# Patient Record
Sex: Male | Born: 1990 | Hispanic: No | Marital: Single | Smoking: Never smoker
Health system: Southern US, Community
[De-identification: ages and names within clinical notes are randomized; demographics above are authoritative.]

## PROBLEM LIST (undated history)

## (undated) HISTORY — PX: TONGUE SURGERY: SHX810

---
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
## 2012-12-14 ENCOUNTER — Encounter (HOSPITAL_COMMUNITY): Payer: Self-pay | Admitting: Emergency Medicine

## 2012-12-14 ENCOUNTER — Emergency Department (INDEPENDENT_AMBULATORY_CARE_PROVIDER_SITE_OTHER): Payer: Self-pay

## 2012-12-14 ENCOUNTER — Emergency Department (INDEPENDENT_AMBULATORY_CARE_PROVIDER_SITE_OTHER)
Admission: EM | Admit: 2012-12-14 | Discharge: 2012-12-14 | Disposition: A | Discharge status: Home / Self Care | Payer: Self-pay | Source: Home / Self Care

## 2012-12-14 DIAGNOSIS — S62009A Unspecified fracture of navicular [scaphoid] bone of unspecified wrist, initial encounter for closed fracture: Secondary | ICD-10-CM

## 2012-12-14 DIAGNOSIS — M6530 Trigger finger, unspecified finger: Secondary | ICD-10-CM

## 2012-12-14 DIAGNOSIS — S62002A Unspecified fracture of navicular [scaphoid] bone of left wrist, initial encounter for closed fracture: Secondary | ICD-10-CM

## 2012-12-14 DIAGNOSIS — M65352 Trigger finger, left little finger: Secondary | ICD-10-CM

## 2012-12-14 MED ORDER — TRAMADOL HCL 50 MG PO TABS: 50.0000 mg | ORAL_TABLET | Freq: Four times a day (QID) | ORAL | Status: DC | PRN

## 2012-12-14 NOTE — ED Provider Notes (Signed)
History     CSN: [id number]  Arrival date & time 12/14/12  1431   First MD Initiated Contact with Patient 12/14/12 1533      Chief Complaint  Patient presents with  . Wrist Pain    (Consider location/radiation/quality/duration/timing/severity/associated sxs/prior treatment) HPI Comments: 22 year old male was playing basketball 2 days ago and fell onto his outstretched left arm. He is complaining of pain in the wrist and hand. Is also complaining of a click in the left finger with flexion and extension. Denies injury to the other extremities. Did not strike his head or injure his neck.   History reviewed. No pertinent past medical history.  History reviewed. No pertinent past surgical history.  History reviewed. No pertinent family history.  History  Substance Use Topics  . Smoking status: Never Smoker   . Smokeless tobacco: Not on file  . Alcohol Use: No      Review of Systems  Constitutional: Negative.   Respiratory: Negative.   Gastrointestinal: Negative.   Genitourinary: Negative.   Musculoskeletal:       As per HPI  Skin: Negative.   Neurological: Negative for dizziness, weakness, numbness and headaches.    Allergies  Review of patient's allergies indicates no known allergies.  Home Medications   Current Outpatient Rx  Name  Route  Sig  Dispense  Refill  . ibuprofen (ADVIL,MOTRIN) 800 MG tablet   Oral   Take 800 mg by mouth every 8 (eight) hours as needed for pain.         . traMADol (ULTRAM) 50 MG tablet   Oral   Take 1 tablet (50 mg total) by mouth every 6 (six) hours as needed for pain.   15 tablet   0     BP 126/82  Pulse 53  Temp(Src) 97.7 F (36.5 C) (Oral)  SpO2 99%  Physical Exam  Nursing note and vitals reviewed. Constitutional: He is oriented to person, place, and time. He appears well-developed and well-nourished.  HENT:  Head: Normocephalic and atraumatic.  Eyes: EOM are normal. Left eye exhibits no discharge.  Neck: Normal  range of motion. Neck supple.  Pulmonary/Chest: Effort normal.  Musculoskeletal:  Tenderness with mild edema over the left wrist especially volar aspect. No deformity. Mild tenderness over the hand greater tenderness over the fifth metacarpal. Is also a click at the MCP of the fifth digit with flexion and extension. No deformity or swelling. Distal neurovascular motor sensory is intact. Radial pulses 2+.  Neurological: He is alert and oriented to person, place, and time. No cranial nerve deficit.  Skin: Skin is warm and dry.  Psychiatric: He has a normal mood and affect.    ED Course  Procedures (including critical care time)  Labs Reviewed - No data to display Dg Wrist Complete Left  12/14/2012   *RADIOLOGY REPORT*  Clinical Data: Wrist pain  LEFT WRIST - COMPLETE 3+ VIEW  Comparison: None.  Findings: There is an acute transverse nondisplaced fracture of the left scaphoid.  Distal radius, ulna and other carpal bones intact. Significant soft tissue swelling. Normal alignment.  IMPRESSION: Nondisplaced acute left scaphoid fracture   Original Report Authenticated By: Judie Petit. Shick, M.D.   Dg Hand Complete Left  12/14/2012   *RADIOLOGY REPORT*  Clinical Data: Fall  LEFT HAND - COMPLETE 3+ VIEW  Comparison: None.  Findings: Nondisplaced fracture through the mid navicular.  No other fracture.  No dislocation.  Negative for arthropathy.  IMPRESSION: Nondisplaced fracture of the navicular.   Original  Report Authenticated By: Janeece Riggers, M.D.     1. Fracture of navicular bone of wrist, left, closed, initial encounter   2. Trigger little finger of left hand       MDM  A radial gutter splint and splint in the little finger in position of function. Elevate Call Dr. Janee Morn Monday for an appointment. Information above May apply ice to the wrist off and on for 20-30 miinutes at a time Tramadol 50 mg every 4-6 hours when necessary pain may also continue the ibuprofen when  necessary        Hayden Rasmussen, NP 12/14/12 1717  Hayden Rasmussen, NP 12/14/12 1719

## 2012-12-14 NOTE — ED Notes (Signed)
Reports injuring left hand and wrist on Tuesday while playing basketball.  Patient reports falling backwards on wrist, hyperflexing wrist.  Reports numbness in palm, intermittent tingling sensation.  Thumb and ring finger click with attempted movement that patient reports is not typical for him.

## 2012-12-14 NOTE — ED Notes (Signed)
Ortho tech called to apply splints. 

## 2012-12-14 NOTE — Progress Notes (Signed)
Orthopedic Tech Progress Note Patient Details:  Timothy Mcguire Jan 18, 1991 [id number]  Ortho Devices Type of Ortho Device: Ace wrap;Finger splint;Rad Gutter splint Ortho Device/Splint Location: left hand Ortho Device/Splint Interventions: Application   Symia Herdt 12/14/2012, 6:13 PM

## 2012-12-14 NOTE — ED Provider Notes (Signed)
Medical screening examination/treatment/procedure(s) were performed by resident physician or non-physician practitioner and as supervising physician I was immediately available for consultation/collaboration.   Barkley Bruns MD.   Linna Hoff, MD 12/14/12 [phone]

## 2012-12-18 NOTE — ED Notes (Signed)
pT  PHONED  REQUESTING   TRAMMADOL    BE  CHANGED   AS   IT  WAS MAKING  HIM ITCH  EARLIER    -  ITCHING  HAS  STOPPED   PT STATES HE  IS IN NO  DISTRESS  -     PT  ADVISED THAT  IF  HE  NEEDED  ANYTHING  STONGER THAN  otc  MEDS  HE  WOULD  NEED  TO BE  RESEEN     HE  WAS  ALSO  ADVISED  TO  CONTINUE  TO NOT TAKE  THE  TRAMMADOL   AS  HE  IS  DOING

## 2012-12-22 ENCOUNTER — Encounter (HOSPITAL_COMMUNITY): Payer: Self-pay | Admitting: *Deleted

## 2012-12-22 ENCOUNTER — Emergency Department (INDEPENDENT_AMBULATORY_CARE_PROVIDER_SITE_OTHER): Payer: Self-pay

## 2012-12-22 ENCOUNTER — Emergency Department (INDEPENDENT_AMBULATORY_CARE_PROVIDER_SITE_OTHER)
Admission: EM | Admit: 2012-12-22 | Discharge: 2012-12-22 | Disposition: A | Discharge status: Home / Self Care | Payer: Self-pay | Source: Home / Self Care

## 2012-12-22 DIAGNOSIS — M6530 Trigger finger, unspecified finger: Secondary | ICD-10-CM

## 2012-12-22 DIAGNOSIS — S5290XD Unspecified fracture of unspecified forearm, subsequent encounter for closed fracture with routine healing: Secondary | ICD-10-CM

## 2012-12-22 DIAGNOSIS — M65352 Trigger finger, left little finger: Secondary | ICD-10-CM

## 2012-12-22 DIAGNOSIS — IMO0001 Reserved for inherently not codable concepts without codable children: Secondary | ICD-10-CM

## 2012-12-22 DIAGNOSIS — S62002G Unspecified fracture of navicular [scaphoid] bone of left wrist, subsequent encounter for fracture with delayed healing: Secondary | ICD-10-CM

## 2012-12-22 MED ORDER — ACETAMINOPHEN-CODEINE 300-30 MG PO TABS: 1.0000 | ORAL_TABLET | ORAL | Status: DC | PRN

## 2012-12-22 NOTE — ED Notes (Signed)
Left wrist splint removed.

## 2012-12-22 NOTE — ED Provider Notes (Signed)
Medical screening examination/treatment/procedure(s) were performed by non-physician practitioner and as supervising physician I was immediately available for consultation/collaboration.  Leslee Home, M.D.  Reuben Likes, MD 12/22/12 [phone]

## 2012-12-22 NOTE — ED Provider Notes (Signed)
History     CSN: [id number]  Arrival date & time 12/22/12  1239   First MD Initiated Contact with Patient 12/22/12 1411      Chief Complaint  Patient presents with  . Wrist Pain    (Consider location/radiation/quality/duration/timing/severity/associated sxs/prior treatment) HPI Comments: This 22 year old patient was seen in the urgent care 18 days ago after a wrist injury in which he was discovered to have a left navicular fracture. It did not eyes to limit the use of the arm and keep it elevated. He is felt to do either. 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 He is complaining of persistent pain in the wrist. He also failed to call for followup with the orthopedist, presumptively due to insurance problems.   History reviewed. No pertinent past medical history.  History reviewed. No pertinent past surgical history.  No family history on file.  History  Substance Use Topics  . Smoking status: Never Smoker   . Smokeless tobacco: Not on file  . Alcohol Use: No      Review of Systems  Constitutional: Negative.   Respiratory: Negative.   Gastrointestinal: Negative.   Genitourinary: Negative.   Musculoskeletal:       As per HPI  Skin: Negative.   Neurological: Negative for dizziness, weakness, numbness and headaches.    Allergies  Tramadol  Home Medications   Current Outpatient Rx  Name  Route  Sig  Dispense  Refill  . Acetaminophen-Codeine (TYLENOL/CODEINE #3) 300-30 MG per tablet   Oral   Take 1 tablet by mouth every 4 (four) hours as needed for pain.   15 tablet   0   . ibuprofen (ADVIL,MOTRIN) 800 MG tablet   Oral   Take 800 mg by mouth every 8 (eight) hours as needed for pain.         . traMADol (ULTRAM) 50 MG tablet   Oral   Take 1 tablet (50 mg total) by mouth every 6 (six) hours as needed for pain.   15 tablet   0      BP 136/89  Pulse 87  Temp(Src) 99.1 F (37.3 C) (Oral)  Resp 16  SpO2 98%  Physical Exam  Nursing note and vitals reviewed. Constitutional: He is oriented to person, place, and time. He appears well-developed and well-nourished.  HENT:  Head: Normocephalic and atraumatic.  Eyes: EOM are normal.  Neck: Normal range of motion. Neck supple.  Pulmonary/Chest: Effort normal.  Musculoskeletal: He exhibits tenderness.  Neurological: He is alert and oriented to person, place, and time. No cranial nerve deficit.  Skin: Skin is warm and dry.  Psychiatric: He has a normal mood and affect.    ED Course  Procedures (including critical care time)  Labs Reviewed - No data to display Dg Wrist Navic Only Left  12/22/2012   *RADIOLOGY REPORT*  Clinical Data: Scaphoid fracture  DG WRIST NAVIC ONLY LEFT  Comparison: None.  Findings: Nondisplaced scaphoid waist fracture.  Associated mild sclerosis, suggesting early healing.  Otherwise unchanged.  IMPRESSION: Nondisplaced scaphoid waist fracture with early sclerosis/healing.   Original Report Authenticated By: Charline Bills, M.D.     1. Fracture of navicular bone of wrist, left, closed, with delayed healing, subsequent encounter   2. Trigger little finger of left hand       MDM  A radial gutter cast be applied  to the left wrist. A finger splint will be applied in position of function to the fifth digit. We will prescribe Tylenol #3 one by mouth q. 4 hours when necessary pain #15 Must followup with the orthopedist as soon as possible, call for appointment.        Hayden Rasmussen, NP 12/22/12 1547

## 2012-12-22 NOTE — ED Notes (Signed)
Pt was seen on 5/16 for left wrist fx; has splint in place.  States the splint "got broke" without trauma - "just from squeezing my thumb and fingers together".  States has not made appt with orthopedist yet.  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  Also requesting pain meds - states the tramadol made him itch.  Has not been taking any IBU or Tyl.  LUE digits all warm, pink, with prompt cap refill.

## 2012-12-22 NOTE — Progress Notes (Signed)
Orthopedic Tech Progress Note Patient Details:  Timothy Mcguire 12/02/1990 [id number]  Casting Type of Cast: Short arm cast (radial gutter cast) Cast Location: left arm Cast Material: Fiberglass Cast Intervention: Application  Finger splint left hand   Nikki Dom 12/22/2012, 4:26 PM

## 2014-04-07 ENCOUNTER — Emergency Department (HOSPITAL_COMMUNITY): Payer: 59

## 2014-04-07 ENCOUNTER — Emergency Department (HOSPITAL_COMMUNITY)
Admission: EM | Admit: 2014-04-07 | Discharge: 2014-04-07 | Disposition: A | Discharge status: Home / Self Care | Payer: 59 | Attending: Emergency Medicine | Admitting: Emergency Medicine

## 2014-04-07 DIAGNOSIS — Y9239 Other specified sports and athletic area as the place of occurrence of the external cause: Secondary | ICD-10-CM | POA: Insufficient documentation

## 2014-04-07 DIAGNOSIS — S99919A Unspecified injury of unspecified ankle, initial encounter: Secondary | ICD-10-CM | POA: Diagnosis present

## 2014-04-07 DIAGNOSIS — W219XXA Striking against or struck by unspecified sports equipment, initial encounter: Secondary | ICD-10-CM | POA: Diagnosis not present

## 2014-04-07 DIAGNOSIS — S8990XA Unspecified injury of unspecified lower leg, initial encounter: Secondary | ICD-10-CM | POA: Insufficient documentation

## 2014-04-07 DIAGNOSIS — Y9367 Activity, basketball: Secondary | ICD-10-CM | POA: Insufficient documentation

## 2014-04-07 DIAGNOSIS — S93409A Sprain of unspecified ligament of unspecified ankle, initial encounter: Secondary | ICD-10-CM | POA: Insufficient documentation

## 2014-04-07 DIAGNOSIS — S99929A Unspecified injury of unspecified foot, initial encounter: Secondary | ICD-10-CM

## 2014-04-07 DIAGNOSIS — S93401A Sprain of unspecified ligament of right ankle, initial encounter: Secondary | ICD-10-CM

## 2014-04-07 DIAGNOSIS — Y92838 Other recreation area as the place of occurrence of the external cause: Secondary | ICD-10-CM

## 2014-04-07 MED ORDER — IBUPROFEN 800 MG PO TABS: 800.0000 mg | ORAL_TABLET | Freq: Three times a day (TID) | ORAL | Status: DC | PRN

## 2014-04-07 MED ORDER — HYDROCODONE-ACETAMINOPHEN 5-325 MG PO TABS: 1.0000 | ORAL_TABLET | Freq: Four times a day (QID) | ORAL | Status: DC | PRN

## 2014-04-07 NOTE — ED Provider Notes (Signed)
CSN: [id number]     Arrival date & time 04/07/14  1859 History   None   This chart was scribed for non-physician practitioner Charlestine Night, PA-C working with Mirian Mo, MD, by Andrew Au, ED Scribe. This patient was seen in room WTR8/WTR8 and the patient's care was started at 7:22 PM. Chief Complaint  Patient presents with  . Ankle Pain    Patient is a 23 y.o. male presenting with ankle pain. The history is provided by the patient. No language interpreter was used.  Ankle Pain  Timothy Mcguire is a 23 y.o. male who presents to the Emergency Department complaining of right ankle injury onset 1 hour ago. Pt reports he sprained his ankle while playing basketball. Pt states he hit right ankle on the edge of court after trying to dunk the ball. Pt reports h/o multiple sprains to right ankle.    No past medical history on file. No past surgical history on file. No family history on file. History  Substance Use Topics  . Smoking status: Never Smoker   . Smokeless tobacco: Not on file  . Alcohol Use: No    Review of Systems  Musculoskeletal: Positive for arthralgias and myalgias.   Allergies  Tramadol  Home Medications   Prior to Admission medications   Medication Sig Start Date End Date Taking? Authorizing Provider  HYDROcodone-acetaminophen (NORCO/VICODIN) 5-325 MG per tablet Take 1 tablet by mouth once as needed for moderate pain.   Yes Historical Provider, MD  ibuprofen (ADVIL,MOTRIN) 800 MG tablet Take 800 mg by mouth every 8 (eight) hours as needed for pain.   Yes Historical Provider, MD   BP 130/90  Pulse 84  Temp(Src) 98.4 F (36.9 C) (Oral)  Resp 16  SpO2 99% Physical Exam  Nursing note and vitals reviewed. Constitutional: He is oriented to person, place, and time. He appears well-developed and well-nourished. No distress.  HENT:  Head: Normocephalic and atraumatic.  Pulmonary/Chest: Effort normal.  Musculoskeletal:       Right foot: He exhibits decreased  range of motion, tenderness and swelling. He exhibits no bony tenderness, normal capillary refill, no crepitus, no deformity and no laceration.  Neurological: He is alert and oriented to person, place, and time.  Skin: Skin is warm and dry.  Psychiatric: He has a normal mood and affect. His behavior is normal.    ED Course  Procedures   Imaging Review Dg Ankle Complete Right  04/07/2014   CLINICAL DATA:  Pain post injury.  EXAM: RIGHT ANKLE - COMPLETE 3+ VIEW  COMPARISON:  None.  FINDINGS: There is no evidence of fracture, dislocation, or joint effusion. There is no evidence of arthropathy or other focal bone abnormality. Soft tissues are unremarkable.  IMPRESSION: Negative.   Electronically Signed   By: Elberta Fortis M.D.   On: 04/07/2014 19:31   The patient will be treated for ankle sprain, place, and an ASO with crutches   SPLINT APPLICATION Date/Time: 7:53 PM Authorized by: Carlyle Dolly Consent: Verbal consent obtained. Risks and benefits: risks, benefits and alternatives were discussed Consent given by: patient Splint applied by: orthopedic technician Location details: Right ankle  Splint type: ASO  Supplies used: Crutches and ASO  Post-procedure: The splinted body part was neurovascularly unchanged following the procedure. Patient tolerance: Patient tolerated the procedure well with no immediate complications.     I personally performed the services described in this documentation, which was scribed in my presence. The recorded information has been reviewed and is  accurate.      Carlyle Dolly, PA-C 04/07/14 1954

## 2014-04-07 NOTE — ED Notes (Signed)
Pt reports he sprained his ankle in the last hour. Pain 8/10. Reports hx of spraining this ankle. Ambulatory.

## 2014-04-07 NOTE — Discharge Instructions (Signed)
Return here as needed.  Ice and elevate your ankle, your x-rays are negative. followup with the orthopedist as needed

## 2014-04-07 NOTE — ED Provider Notes (Signed)
Medical screening examination/treatment/procedure(s) were performed by non-physician practitioner and as supervising physician I was immediately available for consultation/collaboration.   EKG Interpretation None        Janisha Bueso, MD 04/07/14 2346 

## 2014-08-28 ENCOUNTER — Ambulatory Visit (INDEPENDENT_AMBULATORY_CARE_PROVIDER_SITE_OTHER): Payer: 59 | Admitting: Family Medicine

## 2014-08-28 VITALS — BP 136/74 | HR 58 | Temp 98.6°F | Resp 16 | Ht 73.0 in | Wt 176.2 lb

## 2014-08-28 DIAGNOSIS — L42 Pityriasis rosea: Secondary | ICD-10-CM

## 2014-08-28 DIAGNOSIS — B078 Other viral warts: Secondary | ICD-10-CM

## 2014-08-28 NOTE — Progress Notes (Signed)
    MRN: [id number] DOB: [birth year]  Subjective:   Timothy Mcguire is a 24 y.o. male presenting for 3 week history of rash. Patient's rash initially started on left medial forearm, was itchy, red. The rash is has now spread to his right forearm, torso and neck. He has not tried anything for intervention. Denies fevers, pain, pus or drainage from rash, no other lesions on palms, soles of feet, oral lesions. Has not spent time doing yardwork, had exposure to poison ivy/oak. No other contacts with similar rash. Also complains of 3 lesions on his left middle finger which he thinks are warts. They bother him with itching and when he makes a fist/grasps. Denies any other aggravating or relieving factors, no other questions or concerns.  Jaquell currently has no medications in their medication list.  He is allergic to tramadol.  Kaliel  has no past medical history on file. Also  has past surgical history that includes Tongue surgery.  ROS As in subjective.  Objective:   Vitals: BP 136/74 mmHg  Pulse 58  Temp(Src) 98.6 F (37 C) (Oral)  Resp 16  Ht 6\' 1"  (1.854 m)  Wt 176 lb 3.2 oz (79.924 kg)  BMI 23.25 kg/m2  SpO2 98%  Physical Exam  Constitutional: He is oriented to person, place, and time and well-developed, well-nourished, and in no distress.  HENT:  Mouth/Throat: Oropharynx is clear and moist.  Cardiovascular: Normal rate.   Pulmonary/Chest: Effort normal.  Musculoskeletal: Normal range of motion. He exhibits no edema or tenderness.  Neurological: He is alert and oriented to person, place, and time.  Skin: Skin is warm and dry. Rash (Multiple scattered salmon colored lesions over arms, torso and neck 1-2cm in size. Also has 3 papular cauliflower lesions <0.5cm in size over dorsal proximal 3rd left finger.) noted. No erythema.  Psychiatric: Mood and affect normal.    PROCEDURE NOTE: Cryotherapy with liquid nitrogen. Verbal consent obtained. Cleansed skin with alcohol prep pad.  Cryotherapy applied (freeze-thaw-freeze). The patient tolerated the procedure well.  Assessment and Plan :   1. Pityriasis rosea - Anticipatory guidance provided. Advised benaydryl or 2nd generation oral antihistamine for itching. If itching becomes severe, rash worsens or does not resolve in 2 weeks, return to clinic for re-evaluation, consider tinea corporis.  2. Common wart - Treated with cryotherapy today, advised to return in 2 weeks for re-evaluation and/or refreezing.   Wallis BambergMario Suede Greenawalt, PA-C [facility] [phone] 08/28/2014 8:16 PM

## 2014-08-28 NOTE — Patient Instructions (Addendum)
Pityriasis Rosea Pityriasis rosea is a rash which is probably caused by a virus. It generally starts as a scaly, red patch on the trunk (the area of the body that a t-shirt would cover) but does not appear on sun exposed areas. The rash is usually preceded by an initial larger spot called the "herald patch" a week or more before the rest of the rash appears. Generally within one to two days the rash appears rapidly on the trunk, upper arms, and sometimes the upper legs. The rash usually appears as flat, oval patches of scaly pink color. The rash can also be raised and one is able to feel it with a finger. The rash can also be finely crinkled and may slough off leaving a ring of scale around the spot. Sometimes a mild sore throat is present with the rash. It usually affects children and young adults in the spring and autumn. Women are more frequently affected than men. TREATMENT  Pityriasis rosea is a self-limited condition. This means it goes away within 4 to 8 weeks without treatment. The spots may persist for several months, especially in darker-colored skin after the rash has resolved and healed. Benadryl and steroid creams may be used if itching is a problem. SEEK MEDICAL CARE IF:   Your rash does not go away or persists longer than three months.  You develop fever and joint pain.  You develop severe headache and confusion.  You develop breathing difficulty, vomiting and/or extreme weakness. Document Released: 08/24/2001 Document Revised: 10/10/2011 Document Reviewed: 09/12/2008 [facility] Patient Information 2015 [place], [place]. This information is not intended to replace advice given to you by your health care provider. Make sure you discuss any questions you have with your health care provider.    Warts Warts are a common viral infection. They are most commonly caused by the human papillomavirus (HPV). Warts can occur at all ages. However, they occur most frequently in older children and  infrequently in the elderly. Warts may be single or multiple. Location and size varies. Warts can be spread by scratching the wart and then scratching normal skin. The life cycle of warts varies. However, most will disappear over many months to a couple years. Warts commonly do not cause problems (asymptomatic) unless they are over an area of pressure, such as the bottom of the foot. If they are large enough, they may cause pain with walking. DIAGNOSIS  Warts are most commonly diagnosed by their appearance. Tissue samples (biopsies) are not required unless the wart looks abnormal. Most warts have a rough surface, are round, oval, or irregular, and are skin-colored to light yellow, brown, or gray. They are generally less than  inch (1.3 cm), but they can be any size. TREATMENT   Observation or no treatment.  Freezing with liquid nitrogen.  High heat (cautery).  Boosting the body's immunity to fight off the wart (immunotherapy using Candida antigen).  Laser surgery.  Application of various irritants and solutions. HOME CARE INSTRUCTIONS  Follow your caregiver's instructions. No special precautions are necessary. Often, treatment may be followed by a return (recurrence) of warts. Warts are generally difficult to treat and get rid of. If treatment is done in a clinic setting, usually more than 1 treatment is required. This is usually done on only a monthly basis until the wart is completely gone. SEEK IMMEDIATE MEDICAL CARE IF: The treated skin becomes red, puffy (swollen), or painful. Document Released: 04/27/2005 Document Revised: 11/12/2012 Document Reviewed: 10/23/2009 [employer] Patient Information 2015 [place]. This information is not intended to replace advice given to you by your health care provider. Make sure you discuss any questions you have with your health care provider.

## 2014-08-28 NOTE — Progress Notes (Signed)
I have examined pt and agree with plan by Gurney MaxinMike Mani, PA-C.

## 2014-10-06 ENCOUNTER — Encounter (HOSPITAL_COMMUNITY): Payer: Self-pay | Admitting: Emergency Medicine

## 2014-10-06 ENCOUNTER — Emergency Department (HOSPITAL_COMMUNITY): Payer: Self-pay

## 2014-10-06 ENCOUNTER — Emergency Department (HOSPITAL_COMMUNITY)
Admission: EM | Admit: 2014-10-06 | Discharge: 2014-10-06 | Disposition: A | Discharge status: Home / Self Care | Payer: Self-pay | Attending: Emergency Medicine | Admitting: Emergency Medicine

## 2014-10-06 DIAGNOSIS — W228XXD Striking against or struck by other objects, subsequent encounter: Secondary | ICD-10-CM | POA: Insufficient documentation

## 2014-10-06 DIAGNOSIS — S62316G Displaced fracture of base of fifth metacarpal bone, right hand, subsequent encounter for fracture with delayed healing: Secondary | ICD-10-CM | POA: Insufficient documentation

## 2014-10-06 DIAGNOSIS — T1490XA Injury, unspecified, initial encounter: Secondary | ICD-10-CM

## 2014-10-06 MED ORDER — HYDROCODONE-ACETAMINOPHEN 5-325 MG PO TABS: 1.0000 | ORAL_TABLET | Freq: Four times a day (QID) | ORAL | Status: DC | PRN

## 2014-10-06 NOTE — Discharge Instructions (Signed)
You have a mildly angulated and displaced fracture at the base of the fifth metacarpal.  Please follow up with your hand specialist for further management of your symptoms.    Metacarpal Fracture   The metacarpal bones are in the middle of the hand, connecting the fingers to the wrist. A metacarpal fracture is a break in one of these bones. It is common for an injury of the hand to break one or more of these bones. A metacarpal fracture of the fifth (little) finger, near the knuckle, is also known as a boxer's fracture. SYMPTOMS   Severe pain at the time of injury.  Pain, tenderness, swelling (especially the back of the hand).  Bruising of the hand within 48 hours.  Visible deformity, if the fracture out of alignment (displaced).  Numbness or paralysis from swelling in the hand, causing pressure on the blood vessels or nerves (uncommon). CAUSES   Direct hit (trauma) to the hand, such as a striking blow with the fist.  Indirect stress to the hand, such as twisting or violent muscle contraction (uncommon). RISK INCREASES WITH:  Contact sports (football, rugby, soccer).  Sports that require hitting (boxing, martial arts).  History of bone or joint disease, including osteoporosis.  Poor hand strength and flexibility. PREVENTION  Maintain proper conditioning:  Hand and finger strength.  Flexibility and endurance.  For contact sports, wear properly fitted and padded protective equipment for the hand.  Learn and use proper technique when hitting, punching, and landing from a fall. PROGNOSIS If treated properly, metacarpal fractures can be expected to heal within 4 to 6 weeks. For severe injuries, surgery may be needed. RELATED COMPLICATIONS   Fracture does not heal (nonunion).  Heals in a poor position, including twisted fingers (malunion).  Chronic pain, stiffness, or swelling of the hand.  Excessive bleeding in the hand, causing pressure and injury to nerves and blood  vessels (rare).  Unstable or arthritic joint, following repeated injury or delayed treatment.  Hindrance of normal hand growth in children.  Infection in open fractures (skin broken over fracture) or at the incision or pin sites, if surgery was performed.  Shortening or injured bones.  Bony bump (spur) or loss of shape of the knuckles. TREATMENT  Treatment will vary, depending on the extent of the injury. First, ice and medicine will help reduce pain and inflammation. For a single metacarpal fracture that is not displaced and does not involve the joint, restraint is usually sufficient for healing to occur. Multiple metacarpal fractures, fractures that are displaced, or fractures involving the joint may require surgery. Surgery often involves placing pins and screws in the bones, to hold them in place. Restraint of the injury follows surgery, to allow for healing. After restraint (with or without surgery), stretching and strengthening exercises may be needed to regain strength and a full range of motion. Exercises may be done at home or with a therapist. Sometimes, depending on the sport and position, a brace or splint may be needed when first returning to sports. MEDICATION   Do not take pain medicine for 7 days before surgery.  Only take over-the-counter or prescription medicines for pain, fever, or discomfort as directed by your caregiver.  Prescription pain medicines are usually prescribed only after surgery. Use only as directed and only as much as you need. COLD THERAPY  Cold treatment (icing) should be applied for 10 to 15 minutes every 2 to 3 hours for inflammation and pain, and immediately after activity that aggravates your symptoms.  Use ice packs or an ice massage. SEEK IMMEDIATE MEDICAL CARE IF:   Pain, tenderness, or swelling gets worse even with treatment.  You have pain, numbness, or coldness in the hand.  Blue, gray, or dark color appears in the fingernails.  Any of the  following occur after surgery:  You have an oral temperature above 102 F (38.9 C), not controlled by medicine.  You have increased pain, swelling, redness, drainage of fluids, or bleeding in the affected area.  New, unexplained symptoms develop. (Drugs used in treatment may produce side effects.) Document Released: 08/01/1998 Document Revised: 10/10/2011 Document Reviewed: 10/30/2008 [facility] Patient Information 2015 [place]. This information is not intended to replace advice given to you by your health care provider. Make sure you discuss any questions you have with your health care provider.

## 2014-10-06 NOTE — ED Notes (Signed)
Pt c/o pain in swelling in R hand x 3 weeks. Pt was seen and had xrays and was diagnosed with a R hand fracture. Pt followed up with orthopaedics but lost his insurance and was unable to follow through with treatment. Pt also out of pain medication. Pt A&O x4.

## 2014-10-06 NOTE — ED Notes (Signed)
Ortho called 

## 2014-10-06 NOTE — ED Provider Notes (Signed)
CSN: [id number]     Arrival date & time 10/06/14  1403 History  This chart was scribed for non-physician practitioner Fayrene HelperBowie Jalan Fariss, PA-C working with Doug SouSam Jacubowitz, MD by Littie Deedsichard Sun, ED Scribe. This patient was seen in room WTR5/WTR5 and the patient's care was started at 2:09 PM.      Chief Complaint  Patient presents with  . Hand Injury   The history is provided by the patient. No language interpreter was used.   HPI Comments: Timothy Mcguire is a 24 y.o. male who presents to the Emergency Department complaining of sudden onset, sharp right hand pain with swelling secondary to an injury that occurred 3 weeks ago. Patient states that he was hitting a punching bag for training, and that he punched the bag wrong. [facility]. He was found to have fractures in his right hand, and was placed on a splint and pain medication. Patient was recommended to have surgery, but he has not had it done yet due to insurance issues. He has run out of his pain medication since then, and has not followed up with them since.    No past medical history on file. Past Surgical History  Procedure Laterality Date  . Tongue surgery     No family history on file. History  Substance Use Topics  . Smoking status: Never Smoker   . Smokeless tobacco: Not on file  . Alcohol Use: 1.2 oz/week    2 Standard drinks or equivalent per week    Review of Systems  Musculoskeletal: Positive for arthralgias.  Psychiatric/Behavioral: Negative for confusion.      Allergies  Tramadol  Home Medications   Prior to Admission medications   Not on File   BP 168/98 mmHg  Pulse 71  Temp(Src) 98.4 F (36.9 C) (Oral)  Resp 16  SpO2 98% Physical Exam  Constitutional: He is oriented to person, place, and time. He appears well-developed and well-nourished. No distress.  HENT:  Head: Normocephalic and atraumatic.  Mouth/Throat: Oropharynx is clear and moist. No oropharyngeal  exudate.  Eyes: Pupils are equal, round, and reactive to light.  Neck: Neck supple.  Cardiovascular: Normal rate.   Pulmonary/Chest: Effort normal.  Musculoskeletal: He exhibits tenderness.  Right hand: moderate swelling noted to ulnar aspect of hand involving the 4th and 5th metacarpals with obvious deformity. Sensation intact. Wrist with normal flexion and extension, supination and pronation. Brisk capillary refill.  Neurological: He is alert and oriented to person, place, and time. No cranial nerve deficit.  Skin: Skin is warm and dry. No rash noted.  Ecchymosis noted to palm of right hand.  Psychiatric: He has a normal mood and affect. His behavior is normal.  Nursing note and vitals reviewed.   ED Course  Procedures  DIAGNOSTIC STUDIES: Oxygen Saturation is 98% on room air, normal by my interpretation.    COORDINATION OF CARE: 2:13 PM-Discussed treatment plan which includes XR imaging with pt at bedside and pt agreed to plan.    3:13 PM X-ray demonstrate a mildly angulated and displaced fracture at the base of the fifth metacarpal. This is a closed fracture. Ulnar gutter splint applied for protection and support. Pain medication prescribed. [facility], therefore I recommend close follow-up for further management. Care discussed with Dr. Ethelda ChickJacubowitz.  Labs Review Labs Reviewed - No data to display  Imaging Review Dg Hand Complete Right  10/06/2014   CLINICAL DATA:  Pain and  swelling on the ulnar side after punching a wall through weeks ago.  EXAM: RIGHT HAND - COMPLETE 3+ VIEW  COMPARISON:  None.  FINDINGS: There is a fracture the proximal metaphysis of the fifth metacarpal with slight dorsal angulation and 2-3 mm of displacement. No other regional fractures visible.  IMPRESSION: Mildly angulated and displaced fracture at the base of the fifth metacarpal.   Electronically Signed   By: Paulina Fusi M.D.   On: 10/06/2014 15:02     EKG  Interpretation None      MDM   Final diagnoses:  Injury  Closed disp fracture of base of fifth metacarpal bone of right hand, with delayed healing, subsequent encounter    BP 168/98 mmHg  Pulse 71  Temp(Src) 98.4 F (36.9 C) (Oral)  Resp 16  SpO2 98%  I have reviewed nursing notes and vital signs. I personally reviewed the imaging tests through PACS system  I reviewed available ER/hospitalization records thought the EMR   I personally performed the services described in this documentation, which was scribed in my presence. The recorded information has been reviewed and is accurate.     Fayrene Helper, PA-C 10/06/14 1514  Doug Sou, MD 10/06/14 1743

## 2014-10-06 NOTE — Progress Notes (Signed)
  CARE MANAGEMENT ED NOTE 10/06/2014  Patient:  Timothy Mcguire,Timothy Mcguire   Account Number:  [id number]  Date Initiated:  10/06/2014  Documentation initiated by:  Timothy Mcguire,Timothy Mcguire  Subjective/Objective Assessment:   24 yr old self pay [place] pt c/o pain in swelling in R hand x 3 weeks. Pt was seen and had xrays and was diagnosed with a R hand fracture. Pt followed up with orthopaedics but lost his insurance and was unable to f/u through with     Subjective/Objective Assessment Detail:   Tx  Pt cnfirms no insurance coverage and no pcp     Action/Plan:   see notes below   Action/Plan Detail:   Anticipated DC Date:  10/06/2014     Status Recommendation to Physician:   Result of Recommendation:    Other ED Services  Consult Working Plan    DC Planning Services  Other  Outpatient Services - Pt will follow up  PCP issues  [facility] / [facility] (established/new)    Choice offered to / List presented to:            Status of service:  Completed, signed off  ED Comments:   ED Comments Detail:  CM spoke with pt who [place]. CM discussed and provided written information for self pay pcps, importance of pcp for f/u care, www.needymeds.org, www.goodrx.com, discounted pharmacies and other [place] resources such as [facility], [facility], affordable care act,  financial assistance, DSS and  health department  Reviewed resources for [place] self pay pcps like [facility], family medicine at [facility], [facility], general medical clinics, [facility] plus others, medication resources, [facility] out patient pharmacies and housing Pt voiced understanding and appreciation of resources provided  Provided [facility] contact information Pt agreed to referral Cm Completed referral

## 2015-03-12 ENCOUNTER — Emergency Department (HOSPITAL_COMMUNITY)
Admission: EM | Admit: 2015-03-12 | Discharge: 2015-03-12 | Disposition: A | Discharge status: Home / Self Care | Payer: Self-pay | Attending: Emergency Medicine | Admitting: Emergency Medicine

## 2015-03-12 ENCOUNTER — Encounter (HOSPITAL_COMMUNITY): Payer: Self-pay | Admitting: Emergency Medicine

## 2015-03-12 DIAGNOSIS — M79641 Pain in right hand: Secondary | ICD-10-CM

## 2015-03-12 MED ORDER — NAPROXEN 500 MG PO TABS: 500.0000 mg | ORAL_TABLET | Freq: Two times a day (BID) | ORAL | Status: DC

## 2015-03-12 NOTE — ED Notes (Signed)
Pt broke lateral part of right hand in February. Worse cast up until approximately 4 months ago but did not have surgery d/t insurance issues and fear of losing temp job. Lateral right hand slightly deformed and swollen. Increased pain recently. No other c/c.

## 2015-03-12 NOTE — ED Provider Notes (Signed)
CSN: [id number]     Arrival date & time 03/12/15  2215 History  This chart was scribed for non-physician provider Timothy Anis, PA-C, working with Elwin Mocha, MD by Phillis Haggis, ED Scribe. This patient was seen in room WTR7/WTR7 and patient care was started at 11:10 PM.    Chief Complaint  Patient presents with  . Hand Pain  . Hand Injury   The history is provided by the patient. No language interpreter was used.  HPI Comments: Timothy Mcguire is a 24 y.o. male who presents to the Emergency Department complaining of gradually worsening right hand pain onset 2 months ago. States that he broke his hand in February and had the hand casted, but was unable to follow up and get surgery because he did not want to lose his job and did not have insurance. States that the pain has caused him to not be able to sleep and notes a bony deformity to the lateral aspect of the dorsum of the right hand. States he is able to write and grip with the hand but states that it is very painful for him to do so. Pt states that he was given Vicodin when he first broke the hand, but has not taken anything else for pain. Denies joint swelling, wound, color change, numbness or weakness to the area. Pt states that he is right hand dominant.   No past medical history on file. Past Surgical History  Procedure Laterality Date  . Tongue surgery     No family history on file. Social History  Substance Use Topics  . Smoking status: Never Smoker   . Smokeless tobacco: Not on file  . Alcohol Use: 1.2 oz/week    2 Standard drinks or equivalent per week    Review of Systems  Musculoskeletal: Positive for arthralgias. Negative for joint swelling.  Skin: Negative for color change and wound.  Neurological: Negative for weakness and numbness.   Allergies  Tramadol  Home Medications   Prior to Admission medications   Medication Sig Start Date End Date Taking? Authorizing Provider  HYDROcodone-acetaminophen  (NORCO/VICODIN) 5-325 MG per tablet Take 1 tablet by mouth every 6 (six) hours as needed for moderate pain or severe pain. 10/06/14   Fayrene Helper, PA-C   BP 137/83 mmHg  Pulse 66  Temp(Src) 98.6 F (37 C) (Oral)  Resp 16  SpO2 100%  Physical Exam  Constitutional: He is oriented to person, place, and time. He appears well-developed and well-nourished.  HENT:  Head: Normocephalic and atraumatic.  Eyes: EOM are normal.  Neck: Normal range of motion. Neck supple.  Cardiovascular: Normal rate.   Radial and ulnar pulses intact  Pulmonary/Chest: Effort normal.  Musculoskeletal: Normal range of motion.  Tenderness noted to the metacarpal bone of the right 5th finger. Bony deformity noted to the right 5th metacarpal bone.   Neurological: He is alert and oriented to person, place, and time. He has normal strength. No cranial nerve deficit or sensory deficit. He displays a negative Romberg sign.  Skin: Skin is warm and dry.  Cap refill normal  Psychiatric: He has a normal mood and affect. His behavior is normal.  Nursing note and vitals reviewed.   ED Course  Procedures (including critical care time) DIAGNOSTIC STUDIES: Oxygen Saturation is 100% on RA, normal by my interpretation.    COORDINATION OF CARE: 11:12 PM-Discussed treatment plan which includes referral to hand surgeon and Naproxen with pt at bedside and pt agreed to plan.  Labs Review Labs Reviewed - No data to display  Imaging Review No results found.   EKG Interpretation None      MDM   Final diagnoses:  None    1. Right hand pain  Old injury noted to right hand without evidence of acute condition. Discussed follow up with hand ortho. Referral provided.   I personally performed the services described in this documentation, which was scribed in my presence. The recorded information has been reviewed and is accurate.     Timothy Anis, PA-C 03/18/15 1041  Elwin Mocha, MD 03/18/15 [phone]

## 2015-03-12 NOTE — Discharge Instructions (Signed)
FOLLOW UP WITH THE HAND ORTHOPEDIC FOR DEFINITIVE TREATMENT OF OLD INJURY. Cryotherapy Cryotherapy means treatment with cold. Ice or gel packs can be used to reduce both pain and swelling. Ice is the most helpful within the first 24 to 48 hours after an injury or flare-up from overusing a muscle or joint. Sprains, strains, spasms, burning pain, shooting pain, and aches can all be eased with ice. Ice can also be used when recovering from surgery. Ice is effective, has very few side effects, and is safe for most people to use. PRECAUTIONS  Ice is not a safe treatment option for people with:  Raynaud phenomenon. This is a condition affecting small blood vessels in the extremities. Exposure to cold may cause your problems to return.  Cold hypersensitivity. There are many forms of cold hypersensitivity, including:  Cold urticaria. Red, itchy hives appear on the skin when the tissues begin to warm after being iced.  Cold erythema. This is a red, itchy rash caused by exposure to cold.  Cold hemoglobinuria. Red blood cells break down when the tissues begin to warm after being iced. The hemoglobin that carry oxygen are passed into the urine because they cannot combine with blood proteins fast enough.  Numbness or altered sensitivity in the area being iced. If you have any of the following conditions, do not use ice until you have discussed cryotherapy with your caregiver:  Heart conditions, such as arrhythmia, angina, or chronic heart disease.  High blood pressure.  Healing wounds or open skin in the area being iced.  Current infections.  Rheumatoid arthritis.  Poor circulation.  Diabetes. Ice slows the blood flow in the region it is applied. This is beneficial when trying to stop inflamed tissues from spreading irritating chemicals to surrounding tissues. However, if you expose your skin to cold temperatures for too long or without the proper protection, you can damage your skin or nerves.  Watch for signs of skin damage due to cold. HOME CARE INSTRUCTIONS Follow these tips to use ice and cold packs safely.  Place a dry or damp towel between the ice and skin. A damp towel will cool the skin more quickly, so you may need to shorten the time that the ice is used.  For a more rapid response, add gentle compression to the ice.  Ice for no more than 10 to 20 minutes at a time. The bonier the area you are icing, the less time it will take to get the benefits of ice.  Check your skin after 5 minutes to make sure there are no signs of a poor response to cold or skin damage.  Rest 20 minutes or more between uses.  Once your skin is numb, you can end your treatment. You can test numbness by very lightly touching your skin. The touch should be so light that you do not see the skin dimple from the pressure of your fingertip. When using ice, most people will feel these normal sensations in this order: cold, burning, aching, and numbness.  Do not use ice on someone who cannot communicate their responses to pain, such as small children or people with dementia. HOW TO MAKE AN ICE PACK Ice packs are the most common way to use ice therapy. Other methods include ice massage, ice baths, and cryosprays. Muscle creams that cause a cold, tingly feeling do not offer the same benefits that ice offers and should not be used as a substitute unless recommended by your caregiver. To make an ice  pack, do one of the following:  Place crushed ice or a bag of frozen vegetables in a sealable plastic bag. Squeeze out the excess air. Place this bag inside another plastic bag. Slide the bag into a pillowcase or place a damp towel between your skin and the bag.  Mix 3 parts water with 1 part rubbing alcohol. Freeze the mixture in a sealable plastic bag. When you remove the mixture from the freezer, it will be slushy. Squeeze out the excess air. Place this bag inside another plastic bag. Slide the bag into a  pillowcase or place a damp towel between your skin and the bag. SEEK MEDICAL CARE IF:  You develop white spots on your skin. This may give the skin a blotchy (mottled) appearance.  Your skin turns blue or pale.  Your skin becomes waxy or hard.  Your swelling gets worse. MAKE SURE YOU:   Understand these instructions.  Will watch your condition.  Will get help right away if you are not doing well or get worse. Document Released: 03/14/2011 Document Revised: 12/02/2013 Document Reviewed: 03/14/2011 [facility] Patient Information 2015 [place], [place]. This information is not intended to replace advice given to you by your health care provider. Make sure you discuss any questions you have with your health care provider.

## 2015-05-27 ENCOUNTER — Emergency Department (HOSPITAL_COMMUNITY)
Admission: EM | Admit: 2015-05-27 | Discharge: 2015-05-27 | Disposition: A | Discharge status: Home / Self Care | Payer: Self-pay | Attending: Emergency Medicine | Admitting: Emergency Medicine

## 2015-05-27 ENCOUNTER — Emergency Department (HOSPITAL_COMMUNITY): Payer: Self-pay

## 2015-05-27 ENCOUNTER — Encounter (HOSPITAL_COMMUNITY): Payer: Self-pay | Admitting: Emergency Medicine

## 2015-05-27 DIAGNOSIS — S93401A Sprain of unspecified ligament of right ankle, initial encounter: Secondary | ICD-10-CM | POA: Insufficient documentation

## 2015-05-27 DIAGNOSIS — S60221A Contusion of right hand, initial encounter: Secondary | ICD-10-CM | POA: Insufficient documentation

## 2015-05-27 DIAGNOSIS — Y9389 Activity, other specified: Secondary | ICD-10-CM | POA: Insufficient documentation

## 2015-05-27 DIAGNOSIS — Y9289 Other specified places as the place of occurrence of the external cause: Secondary | ICD-10-CM | POA: Insufficient documentation

## 2015-05-27 DIAGNOSIS — W1841XA Slipping, tripping and stumbling without falling due to stepping on object, initial encounter: Secondary | ICD-10-CM | POA: Insufficient documentation

## 2015-05-27 DIAGNOSIS — Y998 Other external cause status: Secondary | ICD-10-CM | POA: Insufficient documentation

## 2015-05-27 MED ORDER — KETOROLAC TROMETHAMINE 60 MG/2ML IM SOLN
60.0000 mg | Freq: Once | INTRAMUSCULAR | Status: AC
  Administered 2015-05-27: 60 mg via INTRAMUSCULAR
  Filled 2015-05-27: qty 2

## 2015-05-27 MED ORDER — NAPROXEN 500 MG PO TABS: 500.0000 mg | ORAL_TABLET | Freq: Two times a day (BID) | ORAL | Status: AC

## 2015-05-27 NOTE — ED Provider Notes (Signed)
CSN: [id number]     Arrival date & time 05/27/15  1408 History  By signing my name below, I, Gwenyth Oberatherine Macek, attest that this documentation has been prepared under the direction and in the presence of Kerrie BuffaloHope Itzae Miralles, NP.  Electronically Signed: Gwenyth Oberatherine Macek, ED Scribe. 05/27/2015. 2:52 PM.   Chief Complaint  Patient presents with  . Ankle Pain   The history is provided by the patient. No language interpreter was used.    HPI Comments: Timothy Mcguire is a 24 y.o. male who presents to the Emergency Department complaining of constant, moderate right ankle pain that radiates along the lateral aspect of his right foot and started PTA. His pain becomes worse with inversion of his right foot. Pt reports that onset of pain started after he rolled his ankle while stepping off a curb. He has a history of right ankle sprains.   Pt also complains of constant, moderate right hand pain that started PTA after he hit his hand on the pavement in the incident above. He states a history of right hand fracture that occurred 1 year ago. Pt denies numbness.   History reviewed. No pertinent past medical history. Past Surgical History  Procedure Laterality Date  . Tongue surgery     History reviewed. No pertinent family history. Social History  Substance Use Topics  . Smoking status: Never Smoker   . Smokeless tobacco: None  . Alcohol Use: 1.2 oz/week    2 Standard drinks or equivalent per week   Review of Systems  Musculoskeletal: Positive for arthralgias.       Right ankle pain and right hand pain  Neurological: Negative for numbness.  All other systems reviewed and are negative.  Allergies  Tramadol  Home Medications   Prior to Admission medications   Medication Sig Start Date End Date Taking? Authorizing Provider  HYDROcodone-acetaminophen (NORCO/VICODIN) 5-325 MG per tablet Take 1 tablet by mouth every 6 (six) hours as needed for moderate pain or severe pain. 10/06/14  Yes Fayrene HelperBowie Tran, PA-C   naproxen (NAPROSYN) 500 MG tablet Take 1 tablet (500 mg total) by mouth 2 (two) times daily. 05/27/15   Azarias Chiou Orlene OchM Johnni Wunschel, NP   BP 137/81 mmHg  Pulse 81  Temp(Src) 97.7 F (36.5 C) (Oral)  Resp 16  SpO2 100% Physical Exam  Constitutional: He is oriented to person, place, and time. He appears well-developed and well-nourished. No distress.  HENT:  Head: Normocephalic and atraumatic.  Eyes: Conjunctivae and EOM are normal.  Neck: Normal range of motion. Neck supple. No tracheal deviation present.  Cardiovascular: Normal rate.   Pulses:      Radial pulses are 2+ on the right side.       Dorsalis pedis pulses are 2+ on the right side.       Posterior tibial pulses are 2+ on the right side.  Pulmonary/Chest: Effort normal. No respiratory distress.  Musculoskeletal: Normal range of motion.       Right ankle: He exhibits ecchymosis. He exhibits normal range of motion, no deformity, no laceration and normal pulse. Swelling: minimal. Tenderness. Lateral malleolus tenderness found. Achilles tendon normal.       Right hand: He exhibits tenderness. He exhibits normal range of motion, normal capillary refill, no laceration and no swelling.       Hands: Right ankle Tenderness over the lateral aspect, pedal pulses 2+, adequate circulation Right hand: Good movement of all fingers; tender over the dorsum of the right hand at the ulnar aspect, radial  pulses 2+, adequate circulation  Neurological: He is alert and oriented to person, place, and time.  Skin: Skin is warm and dry.  Psychiatric: He has a normal mood and affect. His behavior is normal.  Nursing note and vitals reviewed.   ED Course  Procedures   DIAGNOSTIC STUDIES: Oxygen Saturation is 100% on RA, normal by my interpretation.    COORDINATION OF CARE: 2:53 PM Discussed treatment plan with pt which includes x-rays of his right hand and ankle. Pt agreed to plan.  Labs Review Labs Reviewed - No data to display  Imaging Review Dg Ankle  Complete Right  05/27/2015  CLINICAL DATA:  24 year old male with right ankle pain following fall. Initial encounter. EXAM: RIGHT ANKLE - COMPLETE 3+ VIEW COMPARISON:  04/07/2014 radiographs FINDINGS: There is no evidence of fracture, dislocation, or joint effusion. There is no evidence of arthropathy or other focal bone abnormality. Soft tissues are unremarkable. IMPRESSION: Negative. Electronically Signed   By: Harmon Pier M.D.   On: 05/27/2015 15:50   Dg Hand Complete Right  05/27/2015  CLINICAL DATA:  Pain following fall EXAM: RIGHT HAND - COMPLETE 3+ VIEW COMPARISON:  October 06, 2014 FINDINGS: Frontal, oblique, and lateral views were obtained. There is mild bony remodeling with healing response in the area of a prior fracture of the proximal fifth metacarpal noted on prior study. No acute fracture is evident currently. No dislocation. Joint spaces appear intact. No erosive change. IMPRESSION: Bony remodeling with evidence of healing at the site of a prior fracture of the proximal fifth metacarpal. Alignment in this area is near anatomic. No acute fracture. No dislocation. No appreciable arthropathic change. Electronically Signed   By: Bretta Bang III M.D.   On: 05/27/2015 15:49    MDM  24 y.o. male with right ankle and right hand pain s/p fall. Stable for d/c without focal neuro deficits. ASO applied to right ankle, crutches, NSAIDS and follow up with ortho as needed. Discussed with the patient clinical and x-ray findings and plan of care. All questioned fully answered. He will return if any problems arise.   Final diagnoses:  Ankle sprain, right, initial encounter  Contusion, hand, right, initial encounter   I personally performed the services described in this documentation, which was scribed in my presence. The recorded information has been reviewed and is accurate.   [address] [place], NP 05/27/15 2030  Bethann Berkshire, MD 05/28/15 [phone]

## 2015-05-27 NOTE — ED Notes (Signed)
Pt c/o right ankle pain after twisting; pt sts right hand injury as well

## 2015-08-26 ENCOUNTER — Emergency Department (HOSPITAL_COMMUNITY)
Admission: EM | Admit: 2015-08-26 | Discharge: 2015-08-27 | Disposition: A | Discharge status: Home / Self Care | Payer: Self-pay | Attending: Emergency Medicine | Admitting: Emergency Medicine

## 2015-08-26 ENCOUNTER — Encounter (HOSPITAL_COMMUNITY): Payer: Self-pay | Admitting: *Deleted

## 2015-08-26 DIAGNOSIS — R110 Nausea: Secondary | ICD-10-CM | POA: Insufficient documentation

## 2015-08-26 DIAGNOSIS — R319 Hematuria, unspecified: Secondary | ICD-10-CM | POA: Insufficient documentation

## 2015-08-26 DIAGNOSIS — R109 Unspecified abdominal pain: Secondary | ICD-10-CM

## 2015-08-26 DIAGNOSIS — R1013 Epigastric pain: Secondary | ICD-10-CM | POA: Insufficient documentation

## 2015-08-26 LAB — COMPREHENSIVE METABOLIC PANEL
ALT: 26 U/L (ref 17–63)
ANION GAP: 14 (ref 5–15)
AST: 43 U/L — ABNORMAL HIGH (ref 15–41)
Albumin: 4.4 g/dL (ref 3.5–5.0)
Alkaline Phosphatase: 51 U/L (ref 38–126)
BILIRUBIN TOTAL: 0.7 mg/dL (ref 0.3–1.2)
BUN: 11 mg/dL (ref 6–20)
CO2: 24 mmol/L (ref 22–32)
Calcium: 10.1 mg/dL (ref 8.9–10.3)
Chloride: 104 mmol/L (ref 101–111)
Creatinine, Ser: 1.16 mg/dL (ref 0.61–1.24)
Glucose, Bld: 86 mg/dL (ref 65–99)
POTASSIUM: 3.9 mmol/L (ref 3.5–5.1)
Sodium: 142 mmol/L (ref 135–145)
TOTAL PROTEIN: 6.9 g/dL (ref 6.5–8.1)

## 2015-08-26 LAB — CBC
HEMATOCRIT: 41.6 % (ref 39.0–52.0)
HEMOGLOBIN: 14.8 g/dL (ref 13.0–17.0)
MCH: 32.3 pg (ref 26.0–34.0)
MCHC: 35.6 g/dL (ref 30.0–36.0)
MCV: 90.8 fL (ref 78.0–100.0)
Platelets: 268 10*3/uL (ref 150–400)
RBC: 4.58 MIL/uL (ref 4.22–5.81)
RDW: 12.9 % (ref 11.5–15.5)
WBC: 8.2 10*3/uL (ref 4.0–10.5)

## 2015-08-26 LAB — LIPASE, BLOOD: LIPASE: 29 U/L (ref 11–51)

## 2015-08-26 NOTE — ED Notes (Signed)
The pt has abd pain cold sorethroat headache nausea  All for 3 weeks

## 2015-08-26 NOTE — ED Notes (Signed)
Registration at bedside.

## 2015-08-26 NOTE — ED Provider Notes (Signed)
CSN: [id number]     Arrival date & time 08/26/15  2114 History   First MD Initiated Contact with Patient 08/26/15 2151     Chief Complaint  Patient presents with  . Generalized Body Aches    Patient is a 25 y.o. male presenting with abdominal pain. The history is provided by the patient.  Abdominal Pain Pain location:  Epigastric Pain quality: sharp and stabbing   Pain radiates to:  Does not radiate Pain severity:  Moderate Duration:  3 weeks Timing:  Intermittent Progression:  Worsening Chronicity:  New Associated symptoms: nausea   Associated symptoms: no anorexia, no fever and no vomiting   No difficulty swallowing.  He has had a sore throat and headache recently.  History reviewed. No pertinent past medical history. Past Surgical History  Procedure Laterality Date  . Tongue surgery     No family history on file. Social History  Substance Use Topics  . Smoking status: Never Smoker   . Smokeless tobacco: None  . Alcohol Use: 1.2 oz/week    2 Standard drinks or equivalent per week    Review of Systems  Constitutional: Negative for fever.  Gastrointestinal: Positive for nausea and abdominal pain. Negative for vomiting and anorexia.  All other systems reviewed and are negative.     Allergies  Tramadol  Home Medications   Prior to Admission medications   Medication Sig Start Date End Date Taking? Authorizing Provider  ibuprofen (ADVIL,MOTRIN) 200 MG tablet Take 200 mg by mouth every 6 (six) hours as needed for moderate pain.   Yes Historical Provider, MD  MELATONIN PO Take 12 mg by mouth daily as needed. For sleep   Yes Historical Provider, MD  HYDROcodone-acetaminophen (NORCO/VICODIN) 5-325 MG per tablet Take 1 tablet by mouth every 6 (six) hours as needed for moderate pain or severe pain. Patient not taking: Reported on 08/26/2015 10/06/14   Fayrene Helper, PA-C  naproxen (NAPROSYN) 500 MG tablet Take 1 tablet (500 mg total) by mouth 2 (two) times daily. Patient not  taking: Reported on 08/26/2015 05/27/15   Janne Napoleon, NP   BP 137/70 mmHg  Pulse 66  Temp(Src) 98.4 F (36.9 C)  Resp 16  Ht  (1.854 m)  Wt 74.588 kg  BMI 21.70 kg/m2  SpO2 100% Physical Exam  Constitutional: He appears well-developed and well-nourished. No distress.  HENT:  Head: Normocephalic and atraumatic.  Right Ear: External ear normal.  Left Ear: External ear normal.  Mouth/Throat: No posterior oropharyngeal edema.  Eyes: Conjunctivae are normal. Right eye exhibits no discharge. Left eye exhibits no discharge. No scleral icterus.  Neck: Neck supple. No tracheal deviation present.  Cardiovascular: Normal rate, regular rhythm and intact distal pulses.   Pulmonary/Chest: Effort normal and breath sounds normal. No stridor. No respiratory distress. He has no wheezes. He has no rales.  Abdominal: Soft. Bowel sounds are normal. He exhibits no distension. There is tenderness in the epigastric area. There is no rebound and no guarding.  Musculoskeletal: He exhibits no edema or tenderness.  Neurological: He is alert. He has normal strength. No cranial nerve deficit (no facial droop, extraocular movements intact, no slurred speech) or sensory deficit. He exhibits normal muscle tone. He displays no seizure activity. Coordination normal.  Skin: Skin is warm and dry. No rash noted.  Psychiatric: He has a normal mood and affect.  Nursing note and vitals reviewed.   ED Course  Procedures (including critical care time) Labs Review Labs Reviewed  COMPREHENSIVE METABOLIC  PANEL - Abnormal; Notable for the following:    AST 43 (*)    All other components within normal limits  LIPASE, BLOOD  CBC  URINALYSIS, ROUTINE W REFLEX MICROSCOPIC (NOT AT [facility])       MDM  Doubt acute surgical process.  Doubt obstruction, cholecystitis, pancreatitis. Suspect his symptoms are related to gastritis or possibly an ulcer.   UA pending.   Will follow up on that.  If negative, dc home with  antacids.  Follow up with a PCP     Linwood Dibbles, MD 08/27/15 1740

## 2015-08-27 LAB — URINALYSIS, ROUTINE W REFLEX MICROSCOPIC
Bilirubin Urine: NEGATIVE
Glucose, UA: NEGATIVE mg/dL
KETONES UR: NEGATIVE mg/dL
LEUKOCYTES UA: NEGATIVE
NITRITE: NEGATIVE
PH: 6 (ref 5.0–8.0)
PROTEIN: NEGATIVE mg/dL
Specific Gravity, Urine: 1.024 (ref 1.005–1.030)

## 2015-08-27 LAB — URINE MICROSCOPIC-ADD ON: Bacteria, UA: NONE SEEN

## 2015-08-27 MED ORDER — FAMOTIDINE 20 MG PO TABS: 20.0000 mg | ORAL_TABLET | Freq: Two times a day (BID) | ORAL | Status: AC

## 2015-08-27 NOTE — ED Notes (Signed)
Patient able to dress and ambulate independently 

## 2015-08-27 NOTE — ED Notes (Signed)
PA at bedside updating patient.

## 2015-08-27 NOTE — ED Provider Notes (Signed)
Following up on Urinalysis results with patient that has GERD/ gastritis symptoms. UA shows no infection but he does not hematuria. Denies noticing obvious blood in urine.  Results for orders placed or performed during the hospital encounter of 08/26/15  Lipase, blood  Result Value Ref Range   Lipase 29 11 - 51 U/L  Comprehensive metabolic panel  Result Value Ref Range   Sodium 142 135 - 145 mmol/L   Potassium 3.9 3.5 - 5.1 mmol/L   Chloride 104 101 - 111 mmol/L   CO2 24 22 - 32 mmol/L   Glucose, Bld 86 65 - 99 mg/dL   BUN 11 6 - 20 mg/dL   Creatinine, Ser 1.61 0.61 - 1.24 mg/dL   Calcium 09.6 8.9 - 04.5 mg/dL   Total Protein 6.9 6.5 - 8.1 g/dL   Albumin 4.4 3.5 - 5.0 g/dL   AST 43 (H) 15 - 41 U/L   ALT 26 17 - 63 U/L   Alkaline Phosphatase 51 38 - 126 U/L   Total Bilirubin 0.7 0.3 - 1.2 mg/dL   GFR calc non Af Amer >60 >60 mL/min   GFR calc Af Amer >60 >60 mL/min   Anion gap 14 5 - 15  CBC  Result Value Ref Range   WBC 8.2 4.0 - 10.5 K/uL   RBC 4.58 4.22 - 5.81 MIL/uL   Hemoglobin 14.8 13.0 - 17.0 g/dL   HCT 40.9 81.1 - 91.4 %   MCV 90.8 78.0 - 100.0 fL   MCH 32.3 26.0 - 34.0 pg   MCHC 35.6 30.0 - 36.0 g/dL   RDW 78.2 95.6 - 21.3 %   Platelets 268 150 - 400 K/uL  Urinalysis, Routine w reflex microscopic (not at [facility])  Result Value Ref Range   Color, Urine YELLOW YELLOW   APPearance CLEAR CLEAR   Specific Gravity, Urine 1.024 1.005 - 1.030   pH 6.0 5.0 - 8.0   Glucose, UA NEGATIVE NEGATIVE mg/dL   Hgb urine dipstick LARGE (A) NEGATIVE   Bilirubin Urine NEGATIVE NEGATIVE   Ketones, ur NEGATIVE NEGATIVE mg/dL   Protein, ur NEGATIVE NEGATIVE mg/dL   Nitrite NEGATIVE NEGATIVE   Leukocytes, UA NEGATIVE NEGATIVE  Urine microscopic-add on  Result Value Ref Range   Squamous Epithelial / LPF 0-5 (A) NONE SEEN   WBC, UA 0-5 0 - 5 WBC/hpf   RBC / HPF TOO NUMEROUS TO COUNT 0 - 5 RBC/hpf   Bacteria, UA NONE SEEN NONE SEEN    His labs are encouraging without any signs of  risk of bleeding or kidney dysfunction. He has been advised to follow-up with PCP or to referred Urologist to have his urine rechecked in 1 week. Strict return precautions given. Discussed with patient the pattern of his pain. He has no CVA tenderness, dysuria, flank pain, testicular pain, rectal pain. His pain is only epigastric in nature.   Marlon Pel, PA-C 08/27/15 [phone]

## 2015-08-27 NOTE — ED Notes (Signed)
MD at bedside updating patient.

## 2015-08-27 NOTE — ED Notes (Signed)
Pt encouraged to collect urine sample.  Patient up to restroom to attempt.  Pt gait is steady and even.

## 2015-08-27 NOTE — Discharge Instructions (Signed)
Abdominal Pain, Adult °Many things can cause abdominal pain. Usually, abdominal pain is not caused by a disease and will improve without treatment. It can often be observed and treated at home. Your health care provider will do a physical exam and possibly order blood tests and X-rays to help determine the seriousness of your pain. However, in many cases, more time must pass before a clear cause of the pain can be found. Before that point, your health care provider may not know if you need more testing or further treatment. °HOME CARE INSTRUCTIONS °Monitor your abdominal pain for any changes. The following actions may help to alleviate any discomfort you are experiencing: °· Only take over-the-counter or prescription medicines as directed by your health care provider. °· Do not take laxatives unless directed to do so by your health care provider. °· Try a clear liquid diet (broth, tea, or water) as directed by your health care provider. Slowly move to a bland diet as tolerated. °SEEK MEDICAL CARE IF: °· You have unexplained abdominal pain. °· You have abdominal pain associated with nausea or diarrhea. °· You have pain when you urinate or have a bowel movement. °· You experience abdominal pain that wakes you in the night. °· You have abdominal pain that is worsened or improved by eating food. °· You have abdominal pain that is worsened with eating fatty foods. °· You have a fever. °SEEK IMMEDIATE MEDICAL CARE IF: °· Your pain does not go away within 2 hours. °· You keep throwing up (vomiting). °· Your pain is felt only in portions of the abdomen, such as the right side or the left lower portion of the abdomen. °· You pass bloody or black tarry stools. °MAKE SURE YOU: °· Understand these instructions. °· Will watch your condition. °· Will get help right away if you are not doing well or get worse. °  °This information is not intended to replace advice given to you by your health care provider. Make sure you discuss  any questions you have with your health care provider. °  °Document Released: 04/27/2005 Document Revised: 04/08/2015 Document Reviewed: 03/27/2013 °Elsevier Interactive Patient Education ©2016 [employer] ° °Hematuria, Adult °Hematuria is blood in your urine. It can be caused by a bladder infection, kidney infection, prostate infection, kidney stone, or cancer of your urinary tract. Infections can usually be treated with medicine, and a kidney stone usually will pass through your urine. If neither of these is the cause of your hematuria, further workup to find out the reason may be needed. °It is very important that you tell your health care provider about any blood you see in your urine, even if the blood stops without treatment or happens without causing pain. Blood in your urine that happens and then stops and then happens again can be a symptom of a very serious condition. Also, pain is not a symptom in the initial stages of many urinary cancers. °HOME CARE INSTRUCTIONS  °· Drink lots of fluid, 3-4 quarts a day. If you have been diagnosed with an infection, cranberry juice is especially recommended, in addition to large amounts of water. °· Avoid caffeine, tea, and carbonated beverages because they tend to irritate the bladder. °· Avoid alcohol because it may irritate the prostate. °· Take all medicines as directed by your health care provider. °· If you were prescribed an antibiotic medicine, finish it all even if you start to feel better. °· If you have been diagnosed with a kidney stone, follow   your health care provider's instructions regarding straining your urine to catch the stone. °· Empty your bladder often. Avoid holding urine for long periods of time. °· After a bowel movement, women should cleanse front to back. Use each tissue only once. °· Empty your bladder before and after sexual intercourse if you are a male. °SEEK MEDICAL CARE IF: °· You develop back pain. °· You have a fever. °· You have a  feeling of sickness in your stomach (nausea) or vomiting. °· Your symptoms are not better in 3 days. Return sooner if you are getting worse. °SEEK IMMEDIATE MEDICAL CARE IF:  °· You develop severe vomiting and are unable to keep the medicine down. °· You develop severe back or abdominal pain despite taking your medicines. °· You begin passing a large amount of blood or clots in your urine. °· You feel extremely weak or faint, or you pass out. °MAKE SURE YOU:  °· Understand these instructions. °· Will watch your condition. °· Will get help right away if you are not doing well or get worse. °  °This information is not intended to replace advice given to you by your health care provider. Make sure you discuss any questions you have with your health care provider. °  °Document Released: 07/18/2005 Document Revised: 08/08/2014 Document Reviewed: 03/18/2013 °Elsevier Interactive Patient Education ©2016 Elsevier Inc. ° °

## 2015-09-07 ENCOUNTER — Encounter (HOSPITAL_COMMUNITY): Payer: Self-pay | Admitting: Emergency Medicine

## 2015-09-07 ENCOUNTER — Emergency Department (HOSPITAL_COMMUNITY): Payer: Self-pay

## 2015-09-07 ENCOUNTER — Emergency Department (HOSPITAL_COMMUNITY)
Admission: EM | Admit: 2015-09-07 | Discharge: 2015-09-07 | Disposition: A | Discharge status: Home / Self Care | Payer: Self-pay | Attending: Emergency Medicine | Admitting: Emergency Medicine

## 2015-09-07 DIAGNOSIS — Z79899 Other long term (current) drug therapy: Secondary | ICD-10-CM | POA: Insufficient documentation

## 2015-09-07 DIAGNOSIS — J069 Acute upper respiratory infection, unspecified: Secondary | ICD-10-CM | POA: Insufficient documentation

## 2015-09-07 LAB — RAPID STREP SCREEN (MED CTR MEBANE ONLY): Streptococcus, Group A Screen (Direct): NEGATIVE

## 2015-09-07 MED ORDER — LIDOCAINE VISCOUS 2 % MT SOLN
15.0000 mL | Freq: Once | OROMUCOSAL | Status: AC
  Administered 2015-09-07: 15 mL via OROMUCOSAL
  Filled 2015-09-07: qty 15

## 2015-09-07 MED ORDER — BENZONATATE 100 MG PO CAPS: 100.0000 mg | ORAL_CAPSULE | Freq: Three times a day (TID) | ORAL | Status: AC | PRN

## 2015-09-07 MED ORDER — LIDOCAINE VISCOUS 2 % MT SOLN: 20.0000 mL | OROMUCOSAL | Status: AC | PRN

## 2015-09-07 MED ORDER — BENZONATATE 100 MG PO CAPS
100.0000 mg | ORAL_CAPSULE | Freq: Once | ORAL | Status: AC
  Administered 2015-09-07: 100 mg via ORAL
  Filled 2015-09-07: qty 1

## 2015-09-07 NOTE — ED Provider Notes (Signed)
CSN: [id number]     Arrival date & time 09/07/15  2220 History  By signing my name below, I, Elon Spanner, attest that this documentation has been prepared under the direction and in the presence of [employer], PA-C. Electronically Signed: Elon Spanner ED Scribe. 09/07/2015. 10:38 PM.    Chief Complaint  Patient presents with  . Sore Throat  . Cough   The history is provided by the patient. No language interpreter was used.    HPI Comments: Timothy Mcguire is a 25 y.o. male with no chronic conditions who presents to the Emergency Department complaining of a scratchy sore throat onset last week.  Associated symptoms include cough productive of green sputum, CP with cough only, and rhinorrhea.  He has used Dayquil and Nyquil.  Patient does not smoke.  He denies fever or chills.    History reviewed. No pertinent past medical history. Past Surgical History  Procedure Laterality Date  . Tongue surgery     No family history on file. Social History  Substance Use Topics  . Smoking status: Never Smoker   . Smokeless tobacco: None  . Alcohol Use: Yes    Review of Systems  Constitutional: Negative for fever and chills.  HENT: Positive for sore throat.   Respiratory: Positive for cough.   Gastrointestinal: Negative for nausea, vomiting and diarrhea.  All other systems reviewed and are negative.     Allergies  Tramadol  Home Medications   Prior to Admission medications   Medication Sig Start Date End Date Taking? Authorizing Provider  benzonatate (TESSALON) 100 MG capsule Take 1 capsule (100 mg total) by mouth 3 (three) times daily as needed for cough. 09/07/15   Emmert Roethler Patel-Mills, PA-C  famotidine (PEPCID) 20 MG tablet Take 1 tablet (20 mg total) by mouth 2 (two) times daily. 08/27/15   Linwood Dibbles, MD  HYDROcodone-acetaminophen (NORCO/VICODIN) 5-325 MG per tablet Take 1 tablet by mouth every 6 (six) hours as needed for moderate pain or severe pain. Patient not taking: Reported on  08/26/2015 10/06/14   Fayrene Helper, PA-C  ibuprofen (ADVIL,MOTRIN) 200 MG tablet Take 200 mg by mouth every 6 (six) hours as needed for moderate pain.    Historical Provider, MD  lidocaine (XYLOCAINE) 2 % solution Use as directed 20 mLs in the mouth or throat as needed for mouth pain. 09/07/15   Tamre Cass Patel-Mills, PA-C  MELATONIN PO Take 12 mg by mouth daily as needed. For sleep    Historical Provider, MD  naproxen (NAPROSYN) 500 MG tablet Take 1 tablet (500 mg total) by mouth 2 (two) times daily. Patient not taking: Reported on 08/26/2015 05/27/15   Janne Napoleon, NP   BP 133/77 mmHg  Pulse 89  Temp(Src) 98.4 F (36.9 C) (Oral)  Resp 18  Ht  (1.854 m)  Wt 75.892 kg  BMI 22.08 kg/m2  SpO2 98% Physical Exam  Constitutional: He is oriented to person, place, and time. He appears well-developed and well-nourished. No distress.  HENT:  Head: Normocephalic and atraumatic.  No tonsillar edema or erythema.  No hot-potato voice or trismus.  No anterior cervical adenopathy.    Eyes: Conjunctivae and EOM are normal.  Neck: Neck supple. No tracheal deviation present.  Cardiovascular: Normal rate and regular rhythm.   No murmur heard. Pulmonary/Chest: Effort normal and breath sounds normal. No respiratory distress. He has no wheezes.  Lungs clear to auscultation bilaterally.  No wheezing or decreased breath sounds.   Musculoskeletal: Normal range of motion.  Neurological: He is alert and oriented to person, place, and time.  Skin: Skin is warm and dry.  Psychiatric: He has a normal mood and affect. His behavior is normal.  Nursing note and vitals reviewed.   ED Course  Procedures (including critical care time)  DIAGNOSTIC STUDIES: Oxygen Saturation is 98% on RA, normal by my interpretation.    COORDINATION OF CARE:  11:15 PM Awaiting results of strep screen.  Patient acknowledges and agrees with plan.    Labs Review Labs Reviewed  RAPID STREP SCREEN (NOT AT [facility])  CULTURE, GROUP A STREP  [facility])    Imaging Review Dg Chest 2 View  09/07/2015  CLINICAL DATA:  Acute onset of productive cough.  Initial encounter. EXAM: CHEST  2 VIEW COMPARISON:  None. FINDINGS: The lungs are well-aerated and clear. There is no evidence of focal opacification, pleural effusion or pneumothorax. The heart is normal in size; the mediastinal contour is within normal limits. No acute osseous abnormalities are seen. IMPRESSION: No acute cardiopulmonary process seen. Electronically Signed   By: Roanna Raider M.D.   On: 09/07/2015 23:14   I have personally reviewed and evaluated these images and lab results as part of my medical decision-making.   EKG Interpretation None      MDM   Final diagnoses:  URI, acute   Patient presents for URI symptoms. Strep is negative. Chest x-ray is also negative for pneumothorax, fluid, or infiltrate. I discussed that this was most likely viral. I treated symptomatically with Tessalon Perles and viscous lidocaine. Return precautions were discussed and patient is agreeable with plan. Medications  lidocaine (XYLOCAINE) 2 % viscous mouth solution 15 mL (15 mLs Mouth/Throat Given 09/07/15 2331)  benzonatate (TESSALON) capsule 100 mg (100 mg Oral Given 09/07/15 2331)   Filed Vitals:   09/07/15 2225  BP: 133/77  Pulse: 89  Temp: 98.4 F (36.9 C)  Resp: 18   I personally performed the services described in this documentation, which was scribed in my presence. The recorded information has been reviewed and is accurate.   Catha Gosselin, PA-C 09/08/15 0157  Laurence Spates, MD 09/08/15 [phone]

## 2015-09-07 NOTE — ED Notes (Signed)
Pt transported to XRay 

## 2015-09-07 NOTE — ED Notes (Signed)
Pt. reports sore throat with productive cough /chest congestion and mid back pain when coughing onset last week , denies fever or chills.

## 2015-09-07 NOTE — Discharge Instructions (Signed)
Upper Respiratory Infection, Adult Follow up with provider using the resource guide below. Most upper respiratory infections (URIs) are caused by a virus. A URI affects the nose, throat, and upper air passages. The most common type of URI is often called "the common cold." HOME CARE   Take medicines only as told by your doctor.  Gargle warm saltwater or take cough drops to comfort your throat as told by your doctor.  Use a warm mist humidifier or inhale steam from a shower to increase air moisture. This may make it easier to breathe.  Drink enough fluid to keep your pee (urine) clear or pale yellow.  Eat soups and other clear broths.  Have a healthy diet.  Rest as needed.  Go back to work when your fever is gone or your doctor says it is okay.  You may need to stay home longer to avoid giving your URI to others.  You can also wear a face mask and wash your hands often to prevent spread of the virus.  Use your inhaler more if you have asthma.  Do not use any tobacco products, including cigarettes, chewing tobacco, or electronic cigarettes. If you need help quitting, ask your doctor. GET HELP IF:  You are getting worse, not better.  Your symptoms are not helped by medicine.  You have chills.  You are getting more short of breath.  You have brown or red mucus.  You have yellow or brown discharge from your nose.  You have pain in your face, especially when you bend forward.  You have a fever.  You have puffy (swollen) neck glands.  You have pain while swallowing.  You have white areas in the back of your throat. GET HELP RIGHT AWAY IF:   You have very bad or constant:  Headache.  Ear pain.  Pain in your forehead, behind your eyes, and over your cheekbones (sinus pain).  Chest pain.  You have long-lasting (chronic) lung disease and any of the following:  Wheezing.  Long-lasting cough.  Coughing up blood.  A change in your usual mucus.  You have a  stiff neck.  You have changes in your:  Vision.  Hearing.  Thinking.  Mood. MAKE SURE YOU:   Understand these instructions.  Will watch your condition.  Will get help right away if you are not doing well or get worse.   This information is not intended to replace advice given to you by your health care provider. Make sure you discuss any questions you have with your health care provider.   Document Released: 01/04/2008 Document Revised: 12/02/2014 Document Reviewed: 10/23/2013 Elsevier Interactive Patient Education [employer]

## 2015-09-10 LAB — CULTURE, GROUP A STREP (THRC)

## 2015-10-01 ENCOUNTER — Emergency Department (HOSPITAL_COMMUNITY)
Admission: EM | Admit: 2015-10-01 | Discharge: 2015-10-01 | Disposition: A | Discharge status: Home / Self Care | Payer: Self-pay | Attending: Emergency Medicine | Admitting: Emergency Medicine

## 2015-10-01 ENCOUNTER — Encounter (HOSPITAL_COMMUNITY): Payer: Self-pay

## 2015-10-01 ENCOUNTER — Emergency Department (HOSPITAL_COMMUNITY)
Admission: EM | Admit: 2015-10-01 | Discharge: 2015-10-02 | Disposition: A | Discharge status: Home / Self Care | Payer: Self-pay | Attending: Emergency Medicine | Admitting: Emergency Medicine

## 2015-10-01 DIAGNOSIS — X58XXXA Exposure to other specified factors, initial encounter: Secondary | ICD-10-CM | POA: Insufficient documentation

## 2015-10-01 DIAGNOSIS — R0981 Nasal congestion: Secondary | ICD-10-CM | POA: Insufficient documentation

## 2015-10-01 DIAGNOSIS — S0501XA Injury of conjunctiva and corneal abrasion without foreign body, right eye, initial encounter: Secondary | ICD-10-CM

## 2015-10-01 DIAGNOSIS — Y9289 Other specified places as the place of occurrence of the external cause: Secondary | ICD-10-CM | POA: Insufficient documentation

## 2015-10-01 DIAGNOSIS — H578 Other specified disorders of eye and adnexa: Secondary | ICD-10-CM | POA: Insufficient documentation

## 2015-10-01 DIAGNOSIS — Y9389 Activity, other specified: Secondary | ICD-10-CM | POA: Insufficient documentation

## 2015-10-01 DIAGNOSIS — R05 Cough: Secondary | ICD-10-CM | POA: Insufficient documentation

## 2015-10-01 DIAGNOSIS — Z79899 Other long term (current) drug therapy: Secondary | ICD-10-CM | POA: Insufficient documentation

## 2015-10-01 DIAGNOSIS — H109 Unspecified conjunctivitis: Secondary | ICD-10-CM

## 2015-10-01 DIAGNOSIS — Y999 Unspecified external cause status: Secondary | ICD-10-CM | POA: Insufficient documentation

## 2015-10-01 MED ORDER — TETRACAINE HCL 0.5 % OP SOLN
1.0000 [drp] | Freq: Once | OPHTHALMIC | Status: AC
  Administered 2015-10-01: 1 [drp] via OPHTHALMIC
  Filled 2015-10-01: qty 2

## 2015-10-01 MED ORDER — FLUORESCEIN SODIUM 1 MG OP STRP
1.0000 | ORAL_STRIP | Freq: Once | OPHTHALMIC | Status: AC
  Administered 2015-10-01: 1 via OPHTHALMIC
  Filled 2015-10-01: qty 1

## 2015-10-01 MED ORDER — ERYTHROMYCIN 5 MG/GM OP OINT: TOPICAL_OINTMENT | OPHTHALMIC | Status: AC

## 2015-10-01 NOTE — ED Notes (Signed)
Pt presents with c/o redness in his right eye accompanied by drainage that started today. Pt reports he also had some sawdust get into his eye at work today separate from the drainage and redness that started this morning.

## 2015-10-01 NOTE — ED Notes (Signed)
Pt called for 2nd time with no response

## 2015-10-01 NOTE — Discharge Instructions (Signed)
Use antibiotic as directed. Follow-up with eye doctor in 1-2 days if no improvement in symptoms. Return to ER for new or worsening symptoms, any additional concerns.

## 2015-10-01 NOTE — ED Provider Notes (Signed)
CSN: [id number]     Arrival date & time 10/01/15  2237 History  By signing my name below, I, Doreatha Martin, attest that this documentation has been prepared under the direction and in the presence of  [facility], PA-C. Electronically Signed: Doreatha Martin, ED Scribe. 10/01/2015. 11:05 PM.    Chief Complaint  Patient presents with  . Conjunctivitis   The history is provided by the patient. No language interpreter was used.    HPI Comments: Timothy Mcguire is a 26 y.o. male who presents to the Emergency Department complaining of moderate, gradually worsening right eye pain, erythema and itching onset this morning with associated mild blurry vision, discharge. Pt also reports he has had mild cough and congestion recently. He states sick contact with his uncle who had similar symptoms and diagnosed with pink eye. Pt also reports that he was cutting trees today and believes he also might have gotten saw dust in the eye. He denies additional injuries or scratching to the eye. Denies foreign body sensation at present. Pt does not wear glasses or contacts. He denies fever.   History reviewed. No pertinent past medical history. Past Surgical History  Procedure Laterality Date  . Tongue surgery     No family history on file. Social History  Substance Use Topics  . Smoking status: Never Smoker   . Smokeless tobacco: None  . Alcohol Use: Yes    Review of Systems  Constitutional: Negative for fever.  Eyes: Positive for pain, discharge, redness, itching and visual disturbance.   Allergies  Tramadol  Home Medications   Prior to Admission medications   Medication Sig Start Date End Date Taking? Authorizing Provider  benzonatate (TESSALON) 100 MG capsule Take 1 capsule (100 mg total) by mouth 3 (three) times daily as needed for cough. 09/07/15   Catha Gosselin, PA-C  erythromycin ophthalmic ointment Place a 1/2 inch ribbon of ointment into affect eye 4-6 times daily 10/01/15   [facility]  Ambrose Wile, PA-C  famotidine (PEPCID) 20 MG tablet Take 1 tablet (20 mg total) by mouth 2 (two) times daily. 08/27/15   Linwood Dibbles, MD  HYDROcodone-acetaminophen (NORCO/VICODIN) 5-325 MG per tablet Take 1 tablet by mouth every 6 (six) hours as needed for moderate pain or severe pain. Patient not taking: Reported on 08/26/2015 10/06/14   Fayrene Helper, PA-C  ibuprofen (ADVIL,MOTRIN) 200 MG tablet Take 200 mg by mouth every 6 (six) hours as needed for moderate pain.    Historical Provider, MD  lidocaine (XYLOCAINE) 2 % solution Use as directed 20 mLs in the mouth or throat as needed for mouth pain. 09/07/15   Hanna Patel-Mills, PA-C  MELATONIN PO Take 12 mg by mouth daily as needed. For sleep    Historical Provider, MD  naproxen (NAPROSYN) 500 MG tablet Take 1 tablet (500 mg total) by mouth 2 (two) times daily. Patient not taking: Reported on 08/26/2015 05/27/15   Janne Napoleon, NP   BP 123/76 mmHg  Pulse 65  Temp(Src) 98.2 F (36.8 C) (Oral)  Resp 18  Ht  (1.854 m)  Wt 77.111 kg  BMI 22.43 kg/m2  SpO2 100% Physical Exam  Constitutional: He is oriented to person, place, and time. He appears well-developed and well-nourished.  HENT:  Head: Normocephalic and atraumatic.  Mouth/Throat: Oropharynx is clear and moist. No oropharyngeal exudate.  Eyes: EOM are normal. Pupils are equal, round, and reactive to light. Lids are everted and swept, no foreign bodies found. Right conjunctiva is injected.  Left conjunctiva is not injected.  Slit lamp exam:      The right eye shows fluorescein uptake.    Fluorescein uptake as depicted in image.   Neck: Normal range of motion. Neck supple.  Cardiovascular: Normal rate, regular rhythm and normal heart sounds.  Exam reveals no gallop and no friction rub.   No murmur heard. Pulmonary/Chest: Effort normal and breath sounds normal. No respiratory distress. He has no wheezes. He has no rales.  Musculoskeletal: Normal range of motion.  Lymphadenopathy:    He has no  cervical adenopathy.  Neurological: He is alert and oriented to person, place, and time.  Skin: Skin is warm and dry.  Psychiatric: He has a normal mood and affect. His behavior is normal.  Nursing note and vitals reviewed.   ED Course  Procedures (including critical care time) DIAGNOSTIC STUDIES: Oxygen Saturation is 100% on RA, normal by my interpretation.    COORDINATION OF CARE: 11:02 PM Discussed treatment plan with pt at bedside which includes eye drops and pt agreed to plan.   MDM   Final diagnoses:  Corneal abrasion, right, initial encounter  Bacterial conjunctivitis of right eye   Patient presentation consistent with conjunctivitis. Patient states after symptoms began he was working and had sawdust get into his eye. Eye was flushed in ED, then sustained. No signs of foreign bodies, but fluorescein uptake consistent with corneal abrasion. No evidence of, entrapment, consensual photophobia, or herpes keratitis. Pt discharged with erythromycin ointment.  Personal hygiene and frequent handwashing discussed.  Patient advised to follow up with ophthalmologist. Return precautions discussed.  Patient verbalizes understanding and is agreeable with discharge.   I personally performed the services described in this documentation, which was scribed in my presence. The recorded information has been reviewed and is accurate.  [facility] Yeimy Brabant, PA-C 10/02/15 1610  Pricilla Loveless, MD 10/02/15 2351

## 2015-10-01 NOTE — ED Notes (Signed)
C/o pink eye of the r eye that started this morning.

## 2015-10-01 NOTE — ED Notes (Signed)
Called pt 1x, no response 

## 2015-11-01 ENCOUNTER — Emergency Department (HOSPITAL_COMMUNITY): Payer: Self-pay

## 2015-11-01 ENCOUNTER — Encounter (HOSPITAL_COMMUNITY): Payer: Self-pay | Admitting: Emergency Medicine

## 2015-11-01 ENCOUNTER — Emergency Department (HOSPITAL_COMMUNITY)
Admission: EM | Admit: 2015-11-01 | Discharge: 2015-11-01 | Disposition: A | Discharge status: Home / Self Care | Payer: Self-pay | Attending: Physician Assistant | Admitting: Physician Assistant

## 2015-11-01 DIAGNOSIS — S93402A Sprain of unspecified ligament of left ankle, initial encounter: Secondary | ICD-10-CM | POA: Insufficient documentation

## 2015-11-01 DIAGNOSIS — Y9364 Activity, baseball: Secondary | ICD-10-CM | POA: Insufficient documentation

## 2015-11-01 DIAGNOSIS — Y998 Other external cause status: Secondary | ICD-10-CM | POA: Insufficient documentation

## 2015-11-01 DIAGNOSIS — X501XXA Overexertion from prolonged static or awkward postures, initial encounter: Secondary | ICD-10-CM | POA: Insufficient documentation

## 2015-11-01 DIAGNOSIS — Y9289 Other specified places as the place of occurrence of the external cause: Secondary | ICD-10-CM | POA: Insufficient documentation

## 2015-11-01 DIAGNOSIS — Z792 Long term (current) use of antibiotics: Secondary | ICD-10-CM | POA: Insufficient documentation

## 2015-11-01 DIAGNOSIS — Z79899 Other long term (current) drug therapy: Secondary | ICD-10-CM | POA: Insufficient documentation

## 2015-11-01 MED ORDER — OXYCODONE-ACETAMINOPHEN 5-325 MG PO TABS
1.0000 | ORAL_TABLET | Freq: Four times a day (QID) | ORAL | Status: AC | PRN
Start: 2015-11-01 — End: ?

## 2015-11-01 NOTE — ED Provider Notes (Signed)
CSN: [id number]     Arrival date & time 11/01/15  1629 History   First MD Initiated Contact with Patient 11/01/15 1943     Chief Complaint  Patient presents with  . Ankle Pain     (Consider location/radiation/quality/duration/timing/severity/associated sxs/prior Treatment) HPI   Patient's age 25 year old male presenting with sprain ankle. Patient had this happened yesterday while playing softball. This is a left ankle. Patient has no significant abrasions.  Patient is ambulatory. Patient applied his own ankle splint at home.  History reviewed. No pertinent past medical history. Past Surgical History  Procedure Laterality Date  . Tongue surgery     No family history on file. Social History  Substance Use Topics  . Smoking status: Never Smoker   . Smokeless tobacco: None  . Alcohol Use: No    Review of Systems  Constitutional: Negative for activity change.  Respiratory: Negative for shortness of breath.   Cardiovascular: Negative for chest pain.  Gastrointestinal: Negative for abdominal pain.      Allergies  Tramadol  Home Medications   Prior to Admission medications   Medication Sig Start Date End Date Taking? Authorizing Provider  benzonatate (TESSALON) 100 MG capsule Take 1 capsule (100 mg total) by mouth 3 (three) times daily as needed for cough. 09/07/15   Catha GosselinHanna Patel-Mills, PA-C  erythromycin ophthalmic ointment Place a 1/2 inch ribbon of ointment into affect eye 4-6 times daily 10/01/15   [facility] Pil[facility], PA-C  famotidine (PEPCID) 20 MG tablet Take 1 tablet (20 mg total) by mouth 2 (two) times daily. 08/27/15   Linwood DibblesJon Knapp, MD  HYDROcodone-acetaminophen (NORCO/VICODIN) 5-325 MG per tablet Take 1 tablet by mouth every 6 (six) hours as needed for moderate pain or severe pain. Patient not taking: Reported on 08/26/2015 10/06/14   Fayrene HelperBowie Tran, PA-C  ibuprofen (ADVIL,MOTRIN) 200 MG tablet Take 200 mg by mouth every 6 (six) hours as needed for moderate pain.    Historical  Provider, MD  lidocaine (XYLOCAINE) 2 % solution Use as directed 20 mLs in the mouth or throat as needed for mouth pain. 09/07/15   Hanna Patel-Mills, PA-C  MELATONIN PO Take 12 mg by mouth daily as needed. For sleep    Historical Provider, MD  naproxen (NAPROSYN) 500 MG tablet Take 1 tablet (500 mg total) by mouth 2 (two) times daily. Patient not taking: Reported on 08/26/2015 05/27/15   Janne NapoleonHope M Neese, NP   BP 138/70 mmHg  Pulse 71  Temp(Src) 98.1 F (36.7 C) (Oral)  Resp 16  Ht 6\' 1"  (1.854 m)  Wt 173 lb (78.472 kg)  BMI 22.83 kg/m2  SpO2 100% Physical Exam  Constitutional: He is oriented to person, place, and time. He appears well-nourished.  HENT:  Head: Normocephalic.  Eyes: Conjunctivae are normal.  Cardiovascular: Normal rate.   Musculoskeletal:  Range of motion to left ankle limited due to pain. No significant swelling. No abrasions. No significant bruising.  Neurological: He is oriented to person, place, and time. No cranial nerve deficit.  Skin: Skin is warm and dry. No rash noted. He is not diaphoretic. No erythema.  Psychiatric: He has a normal mood and affect. His behavior is normal.  Nursing note and vitals reviewed.   ED Course  Procedures (including critical care time) Labs Review Labs Reviewed - No data to display  Imaging Review Dg Ankle Complete Left  11/01/2015  CLINICAL DATA:  Left ankle pain after fall yesterday, pain medial left ankle EXAM: LEFT ANKLE COMPLETE - 3+ VIEW  COMPARISON:  None. FINDINGS: Small joint effusion. Mild soft tissue swelling medially. No fracture or dislocation. IMPRESSION: Findings suggest mild sprain Electronically Signed   By: Esperanza Heir M.D.   On: 11/01/2015 17:38   I have personally reviewed and evaluated these images and lab results as part of my medical decision-making.   EKG Interpretation None      MDM   Final diagnoses:  None    Patient is a 25 year old male presenting with twisting on the left. X-ray negative.  Significant for sprain. We will have him do light duty work. Pain control.  [place].    Courteney Randall An, MD 11/01/15 2042

## 2015-11-01 NOTE — ED Notes (Addendum)
Onset of L ankle pain while playing softball yesterday. Pt ambulatory and wearing an ASO splint for comfort. Pt states pain is located to the inner portion of his ankle, some swelling noted.

## 2015-11-01 NOTE — Discharge Instructions (Signed)
Ankle Sprain  An ankle sprain is an injury to the strong, fibrous tissues (ligaments) that hold the bones of your ankle joint together.   CAUSES  An ankle sprain is usually caused by a fall or by twisting your ankle. Ankle sprains most commonly occur when you step on the outer edge of your foot, and your ankle turns inward. People who participate in sports are more prone to these types of injuries.   SYMPTOMS    Pain in your ankle. The pain may be present at rest or only when you are trying to stand or walk.   Swelling.   Bruising. Bruising may develop immediately or within 1 to 2 days after your injury.   Difficulty standing or walking, particularly when turning corners or changing directions.  DIAGNOSIS   Your caregiver will ask you details about your injury and perform a physical exam of your ankle to determine if you have an ankle sprain. During the physical exam, your caregiver will press on and apply pressure to specific areas of your foot and ankle. Your caregiver will try to move your ankle in certain ways. An X-ray exam may be done to be sure a bone was not broken or a ligament did not separate from one of the bones in your ankle (avulsion fracture).   TREATMENT   Certain types of braces can help stabilize your ankle. Your caregiver can make a recommendation for this. Your caregiver may recommend the use of medicine for pain. If your sprain is severe, your caregiver may refer you to a surgeon who helps to restore function to parts of your skeletal system (orthopedist) or a physical therapist.  HOME CARE INSTRUCTIONS    Apply ice to your injury for 1-2 days or as directed by your caregiver. Applying ice helps to reduce inflammation and pain.    Put ice in a plastic bag.    Place a towel between your skin and the bag.    Leave the ice on for 15-20 minutes at a time, every 2 hours while you are awake.   Only take over-the-counter or prescription medicines for pain, discomfort, or fever as directed by  your caregiver.   Elevate your injured ankle above the level of your heart as much as possible for 2-3 days.   If your caregiver recommends crutches, use them as instructed. Gradually put weight on the affected ankle. Continue to use crutches or a cane until you can walk without feeling pain in your ankle.   If you have a plaster splint, wear the splint as directed by your caregiver. Do not rest it on anything harder than a pillow for the first 24 hours. Do not put weight on it. Do not get it wet. You may take it off to take a shower or bath.   You may have been given an elastic bandage to wear around your ankle to provide support. If the elastic bandage is too tight (you have numbness or tingling in your foot or your foot becomes cold and blue), adjust the bandage to make it comfortable.   If you have an air splint, you may blow more air into it or let air out to make it more comfortable. You may take your splint off at night and before taking a shower or bath. Wiggle your toes in the splint several times per day to decrease swelling.  SEEK MEDICAL CARE IF:    You have rapidly increasing bruising or swelling.   Your toes feel   extremely cold or you lose feeling in your foot.   Your pain is not relieved with medicine.  SEEK IMMEDIATE MEDICAL CARE IF:   Your toes are numb or blue.   You have severe pain that is increasing.  MAKE SURE YOU:    Understand these instructions.   Will watch your condition.   Will get help right away if you are not doing well or get worse.     This information is not intended to replace advice given to you by your health care provider. Make sure you discuss any questions you have with your health care provider.     Document Released: 07/18/2005 Document Revised: 08/08/2014 Document Reviewed: 07/30/2011  Elsevier Interactive Patient Education 2016 [employer]

## 2016-07-29 ENCOUNTER — Emergency Department (HOSPITAL_COMMUNITY): Payer: Managed Care, Other (non HMO)

## 2016-07-29 ENCOUNTER — Encounter (HOSPITAL_COMMUNITY): Payer: Self-pay | Admitting: Emergency Medicine

## 2016-07-29 ENCOUNTER — Emergency Department (HOSPITAL_COMMUNITY)
Admission: EM | Admit: 2016-07-29 | Discharge: 2016-07-29 | Disposition: A | Discharge status: Home / Self Care | Payer: Managed Care, Other (non HMO) | Attending: Emergency Medicine | Admitting: Emergency Medicine

## 2016-07-29 DIAGNOSIS — Z79899 Other long term (current) drug therapy: Secondary | ICD-10-CM | POA: Diagnosis not present

## 2016-07-29 DIAGNOSIS — L03116 Cellulitis of left lower limb: Secondary | ICD-10-CM

## 2016-07-29 DIAGNOSIS — M79672 Pain in left foot: Secondary | ICD-10-CM | POA: Diagnosis present

## 2016-07-29 MED ORDER — SULFAMETHOXAZOLE-TRIMETHOPRIM 800-160 MG PO TABS
1.0000 | ORAL_TABLET | Freq: Once | ORAL | Status: AC
  Administered 2016-07-29: 1 via ORAL
  Filled 2016-07-29: qty 1

## 2016-07-29 MED ORDER — CEPHALEXIN 250 MG PO CAPS: 250.0000 mg | ORAL_CAPSULE | Freq: Four times a day (QID) | ORAL | 0 refills | Status: AC

## 2016-07-29 MED ORDER — HYDROCODONE-ACETAMINOPHEN 5-325 MG PO TABS: 1.0000 | ORAL_TABLET | Freq: Four times a day (QID) | ORAL | 0 refills | Status: DC | PRN

## 2016-07-29 MED ORDER — SULFAMETHOXAZOLE-TRIMETHOPRIM 800-160 MG PO TABS: 1.0000 | ORAL_TABLET | Freq: Two times a day (BID) | ORAL | 0 refills | Status: AC

## 2016-07-29 MED ORDER — CEPHALEXIN 250 MG PO CAPS
250.0000 mg | ORAL_CAPSULE | Freq: Once | ORAL | Status: AC
  Administered 2016-07-29: 250 mg via ORAL
  Filled 2016-07-29: qty 1

## 2016-07-29 NOTE — ED Notes (Signed)
Pt voices understanding of discharge instructions as well as prescription information. Pt given "Know Your Options" brochure with coupon card.

## 2016-07-29 NOTE — ED Triage Notes (Signed)
Pt to ER by private vehicle for complaint of left foot pain - reddened and swollen. Denies injury or trauma.

## 2016-07-29 NOTE — ED Provider Notes (Signed)
[facility]-EMERGENCY DEPT Provider Note   CSN: [id number] Arrival date & time: 07/29/16  1209  By signing my name below, I, Alyssa GroveMartin Green, attest that this documentation has been prepared under the direction and in the presence of Roxy Horsemanobert Dailynn Nancarrow, PA-C. Electronically Signed: Alyssa GroveMartin Green, ED Scribe. 07/29/16. 12:38 PM.   History   Chief Complaint Chief Complaint  Patient presents with  . Foot Pain   The history is provided by the patient. No language interpreter was used.   HPI Comments: Timothy Skeansdam D Mcguire is a 25 y.o. male who presents to the Emergency Department complaining of gradual onset, constant area of erythema and induration to the 4th toe of the left foot for 3 days. Pt is unaware of what caused the sore on his foot. He states he picked at the area. Pt reports associated moderate, throbbing pain to his foot. He denies injury or trauma to foot. Pt is ambulatory, but states he has been hobbling due to pain. Pt denies PMHx of DM.   History reviewed. No pertinent past medical history.  There are no active problems to display for this patient.   Past Surgical History:  Procedure Laterality Date  . TONGUE SURGERY      Home Medications    Prior to Admission medications   Medication Sig Start Date End Date Taking? Authorizing Provider  benzonatate (TESSALON) 100 MG capsule Take 1 capsule (100 mg total) by mouth 3 (three) times daily as needed for cough. 09/07/15   Catha GosselinHanna Patel-Mills, PA-C  erythromycin ophthalmic ointment Place a 1/2 inch ribbon of ointment into affect eye 4-6 times daily 10/01/15   [facility]ime Pilcher Ward, PA-C  famotidine (PEPCID) 20 MG tablet Take 1 tablet (20 mg total) by mouth 2 (two) times daily. 08/27/15   Linwood DibblesJon Knapp, MD  HYDROcodone-acetaminophen (NORCO/VICODIN) 5-325 MG per tablet Take 1 tablet by mouth every 6 (six) hours as needed for moderate pain or severe pain. Patient not taking: Reported on 08/26/2015 10/06/14   Fayrene HelperBowie Tran, PA-C  ibuprofen (ADVIL,MOTRIN) 200 MG  tablet Take 200 mg by mouth every 6 (six) hours as needed for moderate pain.    Historical Provider, MD  lidocaine (XYLOCAINE) 2 % solution Use as directed 20 mLs in the mouth or throat as needed for mouth pain. 09/07/15   Hanna Patel-Mills, PA-C  MELATONIN PO Take 12 mg by mouth daily as needed. For sleep    Historical Provider, MD  naproxen (NAPROSYN) 500 MG tablet Take 1 tablet (500 mg total) by mouth 2 (two) times daily. Patient not taking: Reported on 08/26/2015 05/27/15   Janne NapoleonHope M Neese, NP  oxyCODONE-acetaminophen (PERCOCET/ROXICET) 5-325 MG tablet Take 1 tablet by mouth every 6 (six) hours as needed for severe pain. 11/01/15   Courteney Lyn Mackuen, MD    Family History History reviewed. No pertinent family history.  Social History Social History  Substance Use Topics  . Smoking status: Never Smoker  . Smokeless tobacco: Never Used  . Alcohol use No     Allergies   Tramadol   Review of Systems Review of Systems  Constitutional: Negative for fever.  Musculoskeletal: Positive for arthralgias.  Skin: Positive for color change and wound.   Physical Exam Updated Vital Signs BP 122/78 (BP Location: Right Arm)   Pulse 81   Temp 98 F (36.7 C) (Oral)   SpO2 100%   Physical Exam Physical Exam  Constitutional: Pt appears well-developed and well-nourished. No distress.  HENT:  Head: Normocephalic and atraumatic.  Eyes: Conjunctivae are normal.  Neck: Normal range of motion.  Cardiovascular: Normal rate, regular rhythm and intact distal pulses.   Capillary refill < 3 sec  Pulmonary/Chest: Effort normal and breath sounds normal.  Musculoskeletal: Pt exhibits tenderness to palpation of left 4th toe. Pt exhibits no edema.  ROM: 5/5  Neurological: Pt  is alert. Coordination normal.  Sensation 5/5 Strength 5/5  Skin: Skin is warm and dry. Pt is not diaphoretic.  Erythema of skin around left 4th toe with small open wound without abscess as pictured Psychiatric: Pt has a normal  mood and affect.  Nursing note and vitals reviewed.      ED Treatments / Results  DIAGNOSTIC STUDIES: Oxygen Saturation is 100% on RA, normal by my interpretation.    COORDINATION OF CARE: 12:33 PM Discussed treatment plan with pt at bedside which includes XR of left foot and pt agreed to plan. Discussed return precautions with pt.  Labs (all labs ordered are listed, but only abnormal results are displayed) Labs Reviewed - No data to display  EKG  EKG Interpretation None       Radiology Dg Foot Complete Left  Result Date: 07/29/2016 CLINICAL DATA:  Left foot swelling and erythema for 2 days. No known injury. Worsening pain with weight-bearing. EXAM: LEFT FOOT - COMPLETE 3+ VIEW COMPARISON:  Ankle radiographs 11/01/2015. FINDINGS: The mineralization and alignment are normal. There is no evidence of acute fracture or dislocation. There is a mild hallux valgus deformity. Relatively elongated second metatarsal noted. No focal soft tissue swelling, foreign body or emphysema seen. IMPRESSION: No acute findings.  Mild hallux valgus. Electronically Signed   By: Carey BullocksWilliam  Veazey M.D.   On: 07/29/2016 13:16    Procedures Procedures (including critical care time)  Medications Ordered in ED Medications - No data to display   Initial Impression / Assessment and Plan / ED Course  I have reviewed the triage vital signs and the nursing notes.  Pertinent labs & imaging results that were available during my care of the patient were reviewed by me and considered in my medical decision making (see chart for details).  Clinical Course    Cellulitis without abscess.  No hx of DM.  VSS.  Outpatient treatment indicated.  Return precautions discussed.  Final Clinical Impressions(s) / ED Diagnoses   Final diagnoses:  Cellulitis of left lower extremity    New Prescriptions New Prescriptions   CEPHALEXIN (KEFLEX) 250 MG CAPSULE    Take 1 capsule (250 mg total) by mouth 4 (four) times  daily.   HYDROCODONE-ACETAMINOPHEN (NORCO/VICODIN) 5-325 MG TABLET    Take 1-2 tablets by mouth every 6 (six) hours as needed.   SULFAMETHOXAZOLE-TRIMETHOPRIM (BACTRIM DS,SEPTRA DS) 800-160 MG TABLET    Take 1 tablet by mouth 2 (two) times daily.   I personally performed the services described in this documentation, which was scribed in my presence. The recorded information has been reviewed and is accurate.      Roxy HorsemanRobert Batool Majid, PA-C 07/29/16 1328    Mancel BaleElliott Wentz, MD 07/29/16 [phone]

## 2016-08-02 ENCOUNTER — Encounter (HOSPITAL_COMMUNITY): Payer: Self-pay | Admitting: Emergency Medicine

## 2016-08-02 DIAGNOSIS — L03031 Cellulitis of right toe: Secondary | ICD-10-CM | POA: Diagnosis present

## 2016-08-02 DIAGNOSIS — L02611 Cutaneous abscess of right foot: Secondary | ICD-10-CM | POA: Diagnosis not present

## 2016-08-02 DIAGNOSIS — Z79899 Other long term (current) drug therapy: Secondary | ICD-10-CM | POA: Diagnosis not present

## 2016-08-02 LAB — CBC WITH DIFFERENTIAL/PLATELET
Basophils Absolute: 0 10*3/uL (ref 0.0–0.1)
Basophils Relative: 1 %
EOS ABS: 0.4 10*3/uL (ref 0.0–0.7)
EOS PCT: 6 %
HCT: 44.2 % (ref 39.0–52.0)
Hemoglobin: 16.3 g/dL (ref 13.0–17.0)
LYMPHS ABS: 1.4 10*3/uL (ref 0.7–4.0)
LYMPHS PCT: 24 %
MCH: 32.4 pg (ref 26.0–34.0)
MCHC: 36.9 g/dL — AB (ref 30.0–36.0)
MCV: 87.9 fL (ref 78.0–100.0)
MONO ABS: 0.4 10*3/uL (ref 0.1–1.0)
Monocytes Relative: 6 %
Neutro Abs: 3.8 10*3/uL (ref 1.7–7.7)
Neutrophils Relative %: 63 %
PLATELETS: 231 10*3/uL (ref 150–400)
RBC: 5.03 MIL/uL (ref 4.22–5.81)
RDW: 12.4 % (ref 11.5–15.5)
WBC: 6 10*3/uL (ref 4.0–10.5)

## 2016-08-02 LAB — BASIC METABOLIC PANEL
Anion gap: 13 (ref 5–15)
BUN: 18 mg/dL (ref 6–20)
CALCIUM: 10.1 mg/dL (ref 8.9–10.3)
CO2: 21 mmol/L — ABNORMAL LOW (ref 22–32)
CREATININE: 1.33 mg/dL — AB (ref 0.61–1.24)
Chloride: 104 mmol/L (ref 101–111)
GFR calc Af Amer: >60 mL/min (ref >60)
GLUCOSE: 64 mg/dL — AB (ref 65–99)
Potassium: 4.5 mmol/L (ref 3.5–5.1)
Sodium: 138 mmol/L (ref 135–145)

## 2016-08-02 NOTE — ED Triage Notes (Signed)
Patient reports left toe infection with swelling and drainage onset last week , he was seen here last week prescribed with Keflex /Bactrim DS with no improvement . Denies fever or chills .

## 2016-08-03 ENCOUNTER — Emergency Department (HOSPITAL_COMMUNITY)
Admission: EM | Admit: 2016-08-03 | Discharge: 2016-08-03 | Disposition: A | Discharge status: Home / Self Care | Payer: Managed Care, Other (non HMO) | Attending: Emergency Medicine | Admitting: Emergency Medicine

## 2016-08-03 DIAGNOSIS — L03119 Cellulitis of unspecified part of limb: Secondary | ICD-10-CM

## 2016-08-03 DIAGNOSIS — L02619 Cutaneous abscess of unspecified foot: Secondary | ICD-10-CM

## 2016-08-03 MED ORDER — IBUPROFEN 400 MG PO TABS
600.0000 mg | ORAL_TABLET | Freq: Once | ORAL | Status: AC
  Administered 2016-08-03: 600 mg via ORAL
  Filled 2016-08-03: qty 1

## 2016-08-03 MED ORDER — HYDROCODONE-ACETAMINOPHEN 5-325 MG PO TABS
1.0000 | ORAL_TABLET | Freq: Once | ORAL | Status: AC
  Administered 2016-08-03: 1 via ORAL
  Filled 2016-08-03: qty 1

## 2016-08-03 MED ORDER — HYDROCODONE-ACETAMINOPHEN 5-325 MG PO TABS: 1.0000 | ORAL_TABLET | Freq: Four times a day (QID) | ORAL | 0 refills | Status: AC | PRN

## 2016-08-03 NOTE — ED Provider Notes (Signed)
[facility]-EMERGENCY DEPT Provider Note   CSN: [id number] Arrival date & time: 08/02/16  2045     History   Chief Complaint Chief Complaint  Patient presents with  . Toe infection    HPI Timothy Mcguire is a 26 y.o. male.  The [age]-year-old male who is seen yesterday for cellulitis of his right fourth toe.  The area was outlined and he was started on antibiotic since today with increased pain but states that the area looks better and it did drain quite a bit of purulent material.  Denies any fever      History reviewed. No pertinent past medical history.  There are no active problems to display for this patient.   Past Surgical History:  Procedure Laterality Date  . TONGUE SURGERY         Home Medications    Prior to Admission medications   Medication Sig Start Date End Date Taking? Authorizing Provider  benzonatate (TESSALON) 100 MG capsule Take 1 capsule (100 mg total) by mouth 3 (three) times daily as needed for cough. 09/07/15   Hanna Patel-Mills, PA-C  cephALEXin (KEFLEX) 250 MG capsule Take 1 capsule (250 mg total) by mouth 4 (four) times daily. 07/29/16   Roxy Horsemanobert Browning, PA-C  erythromycin ophthalmic ointment Place a 1/2 inch ribbon of ointment into affect eye 4-6 times daily 10/01/15   [facility] Pilcher Ward, PA-C  famotidine (PEPCID) 20 MG tablet Take 1 tablet (20 mg total) by mouth 2 (two) times daily. 08/27/15   Linwood DibblesJon Knapp, MD  HYDROcodone-acetaminophen (NORCO/VICODIN) 5-325 MG tablet Take 1 tablet by mouth every 6 (six) hours as needed for severe pain. 08/03/16   Earley FavorGail Nivin Braniff, NP  ibuprofen (ADVIL,MOTRIN) 200 MG tablet Take 200 mg by mouth every 6 (six) hours as needed for moderate pain.    Historical Provider, MD  lidocaine (XYLOCAINE) 2 % solution Use as directed 20 mLs in the mouth or throat as needed for mouth pain. 09/07/15   Hanna Patel-Mills, PA-C  MELATONIN PO Take 12 mg by mouth daily as needed. For sleep    Historical Provider, MD  naproxen (NAPROSYN) 500 MG tablet  Take 1 tablet (500 mg total) by mouth 2 (two) times daily. Patient not taking: Reported on 08/26/2015 05/27/15   Janne NapoleonHope M Neese, NP  oxyCODONE-acetaminophen (PERCOCET/ROXICET) 5-325 MG tablet Take 1 tablet by mouth every 6 (six) hours as needed for severe pain. 11/01/15   Courteney Lyn Mackuen, MD  sulfamethoxazole-trimethoprim (BACTRIM DS,SEPTRA DS) 800-160 MG tablet Take 1 tablet by mouth 2 (two) times daily. 07/29/16 08/05/16  Roxy Horsemanobert Browning, PA-C    Family History No family history on file.  Social History Social History  Substance Use Topics  . Smoking status: Never Smoker  . Smokeless tobacco: Never Used  . Alcohol use No     Allergies   Tramadol   Review of Systems Review of Systems  Constitutional: Negative for fever.  Musculoskeletal: Negative for myalgias.  Skin: Positive for wound.  All other systems reviewed and are negative.    Physical Exam Updated Vital Signs BP 139/93 (BP Location: Left Arm)   Pulse 73   Temp 98.3 F (36.8 C) (Oral)   Resp 16   SpO2 98%   Physical Exam  Constitutional: He appears well-developed and well-nourished.  Eyes: Pupils are equal, round, and reactive to light.  Neck: Normal range of motion.  Cardiovascular: Normal rate.   Pulmonary/Chest: Effort normal.  Musculoskeletal: Normal range of motion.  Neurological: He is alert.  Skin:  Skin is warm. There is erythema.  Previous area of erythema has resolved and is only located to immediately around the open area on the fourth toe.  There is minimal swelling to the area.  There is no active drainage at this time     ED Treatments / Results  Labs (all labs ordered are listed, but only abnormal results are displayed) Labs Reviewed  CBC WITH DIFFERENTIAL/PLATELET - Abnormal; Notable for the following:       Result Value   MCHC 36.9 (*)    All other components within normal limits  BASIC METABOLIC PANEL - Abnormal; Notable for the following:    CO2 21 (*)    Glucose, Bld 64 (*)     Creatinine, Ser 1.33 (*)    All other components within normal limits    EKG  EKG Interpretation None       Radiology No results found.  Procedures Procedures (including critical care time)  Medications Ordered in ED Medications  HYDROcodone-acetaminophen (NORCO/VICODIN) 5-325 MG per tablet 1 tablet (not administered)  ibuprofen (ADVIL,MOTRIN) tablet 600 mg (not administered)     Initial Impression / Assessment and Plan / ED Course  I have reviewed the triage vital signs and the nursing notes.  Pertinent labs & imaging results that were available during my care of the patient were reviewed by me and considered in my medical decision making (see chart for details).  Clinical Course    Area looks improved from 24 hours previously.  Erythema has decreased.  The abscess/cellulitis has drained significant amount.  I feel that the antibiotic is appropriate at this time is to continue taking his antibiotics as directed.  I will give him a prescription for ibuprofen and Vicodin for severe pain, and return instructions    Final Clinical Impressions(s) / ED Diagnoses   Final diagnoses:  Cellulitis and abscess of foot    New Prescriptions Current Discharge Medication List       Earley Favor, NP 08/03/16 0059    Earley Favor, NP 08/03/16 0104    Shon Baton, MD 08/03/16 [phone]

## 2016-08-03 NOTE — Discharge Instructions (Signed)
The cellulitis/abscess of your toe appears to be improving.  Please continue to take the antibiotic as previously directed.  I would like you to take the ibuprofen on a regular basis 1 tablet every 6 hours you've also been given an additional prescription for Vicodin that he can use for severe pain.

## 2016-08-08 ENCOUNTER — Encounter (HOSPITAL_COMMUNITY): Payer: Self-pay | Admitting: Emergency Medicine

## 2016-08-08 ENCOUNTER — Emergency Department (HOSPITAL_COMMUNITY)
Admission: EM | Admit: 2016-08-08 | Discharge: 2016-08-08 | Disposition: A | Discharge status: Home / Self Care | Payer: Managed Care, Other (non HMO) | Attending: Dermatology | Admitting: Dermatology

## 2016-08-08 DIAGNOSIS — Z4800 Encounter for change or removal of nonsurgical wound dressing: Secondary | ICD-10-CM | POA: Diagnosis not present

## 2016-08-08 DIAGNOSIS — Z5321 Procedure and treatment not carried out due to patient leaving prior to being seen by health care provider: Secondary | ICD-10-CM | POA: Diagnosis not present

## 2016-08-08 NOTE — ED Triage Notes (Signed)
Pt here for wound check to left foot from spider bite and needs a note to go back to work

## 2016-08-08 NOTE — ED Notes (Signed)
Unable to locate pt  

## 2016-08-09 ENCOUNTER — Encounter (HOSPITAL_COMMUNITY): Payer: Self-pay | Admitting: *Deleted

## 2016-08-09 ENCOUNTER — Ambulatory Visit (HOSPITAL_COMMUNITY)
Admission: EM | Admit: 2016-08-09 | Discharge: 2016-08-09 | Disposition: A | Discharge status: Home / Self Care | Payer: Managed Care, Other (non HMO) | Attending: Family Medicine | Admitting: Family Medicine

## 2016-08-09 DIAGNOSIS — M79675 Pain in left toe(s): Secondary | ICD-10-CM

## 2016-08-09 NOTE — ED Provider Notes (Signed)
CSN: [id number]     Arrival date & time 08/09/16  1239 History   First MD Initiated Contact with Patient 08/09/16 1428     Chief Complaint  Patient presents with  . Follow-up   (Consider location/radiation/quality/duration/timing/severity/associated sxs/prior Treatment) 26 year old male was seen in the emergency department nearly 2 weeks ago for cellulitis of the left fourth toe. He was treated with double antibiotics and then followed up approximately 3 days later where it was described that the cellulitis was resolving. He returns today for a work note to go back tomorrow.      History reviewed. No pertinent past medical history. Past Surgical History:  Procedure Laterality Date  . TONGUE SURGERY     History reviewed. No pertinent family history. Social History  Substance Use Topics  . Smoking status: Never Smoker  . Smokeless tobacco: Never Used  . Alcohol use No    Review of Systems  Musculoskeletal:       Pain and tenderness to the left fourth toe. Worse with ambulation and flexion and extension of the left fourth toe.  Skin: Positive for color change.  Neurological: Negative.   All other systems reviewed and are negative.   Allergies  Tramadol  Home Medications   Prior to Admission medications   Medication Sig Start Date End Date Taking? Authorizing Provider  benzonatate (TESSALON) 100 MG capsule Take 1 capsule (100 mg total) by mouth 3 (three) times daily as needed for cough. 09/07/15   Hanna Patel-Mills, PA-C  cephALEXin (KEFLEX) 250 MG capsule Take 1 capsule (250 mg total) by mouth 4 (four) times daily. 07/29/16   Roxy Horsemanobert Browning, PA-C  erythromycin ophthalmic ointment Place a 1/2 inch ribbon of ointment into affect eye 4-6 times daily 10/01/15   [facility], PA-[facility]  famotidine (PEPCID) 20 MG tablet Take 1 tablet (20 mg total) by mouth 2 (two) times daily. 08/27/15   Linwood DibblesJon Knapp, MD  HYDROcodone-acetaminophen (NORCO/VICODIN) 5-325 MG tablet Take 1 tablet by mouth  every 6 (six) hours as needed for severe pain. 08/03/16   Earley FavorGail Schulz, NP  ibuprofen (ADVIL,MOTRIN) 200 MG tablet Take 200 mg by mouth every 6 (six) hours as needed for moderate pain.    Historical Provider, MD  lidocaine (XYLOCAINE) 2 % solution Use as directed 20 mLs in the mouth or throat as needed for mouth pain. 09/07/15   Hanna Patel-Mills, PA-C  MELATONIN PO Take 12 mg by mouth daily as needed. For sleep    Historical Provider, MD  naproxen (NAPROSYN) 500 MG tablet Take 1 tablet (500 mg total) by mouth 2 (two) times daily. Patient not taking: Reported on 08/26/2015 05/27/15   Janne NapoleonHope M Neese, NP  oxyCODONE-acetaminophen (PERCOCET/ROXICET) 5-325 MG tablet Take 1 tablet by mouth every 6 (six) hours as needed for severe pain. 11/01/15   Courteney Lyn Mackuen, MD   Meds Ordered and Administered this Visit  Medications - No data to display  BP 124/78 (BP Location: Right Arm)   Pulse 78   Temp 98.4 F (36.9 C) (Oral)   Resp 18   SpO2 100%  No data found.   Physical Exam  Constitutional: He is oriented to person, place, and time. He appears well-developed and well-nourished. No distress.  Musculoskeletal:  Left fourth toe with a bruised purplish discoloration in the proximal phalanx. Capillary refill is somewhat delayed but identical to the adjacent toes. There is pain with flexion and extension of the toe passively. No erythema or cellulitis. No lymphangitis. No drainage of purulence  or other fluid. No fluctuance and no evidence of abscess formation. Some scaling of the epidermis adjacent to the MTP joint.  Neurological: He is alert and oriented to person, place, and time.  Skin: Skin is warm and dry.  Nursing note and vitals reviewed.   Urgent Care Course   Clinical Course     Procedures (including critical care time)  Labs Review Labs Reviewed - No data to display  Imaging Review No results found.   Visual Acuity Review  Right Eye Distance:   Left Eye Distance:   Bilateral  Distance:    Right Eye Near:   Left Eye Near:    Bilateral Near:         MDM   1. Toe pain, left   Uncertain as to the etiology of pain and tenderness to the MTP joint of the left fourth toe without evidence of infection. His company is also expecting to have several work-related papers completed at some time. He is therefore referred to occupational health especially since he has not completely healed and a time period in which he should have. The primary infection of the left fourth toe appears to be healed. There continues to be minor swelling and tenderness at the MTP joint. Due to this amount tenderness in the type of job he has it is unlikely he will be able to go back to work today. He is being referred to occupational health and to call them today for an appointment.     Hayden Rasmussen, NP 08/09/16 1504

## 2016-08-09 NOTE — Discharge Instructions (Signed)
The primary infection of the left fourth toe appears to be healed. There continues to be minor swelling and tenderness at the MTP joint. Due to this amount tenderness in the type of job he has it is unlikely he will be able to go back to work today. He is being referred to occupational health and to call them today for an appointment.

## 2016-08-09 NOTE — ED Triage Notes (Signed)
Seen   Er    approx  2  Weeks  Ago       For   [employer]ider  Bite   Pt   States  It  Is  Doing     Better and  Needs  A   Work  Note

## 2016-09-01 ENCOUNTER — Inpatient Hospital Stay: Payer: Managed Care, Other (non HMO)

## 2016-09-12 ENCOUNTER — Encounter: Payer: Self-pay | Admitting: Family Medicine

## 2016-09-12 ENCOUNTER — Ambulatory Visit: Payer: Managed Care, Other (non HMO) | Attending: Family Medicine | Admitting: Family Medicine

## 2016-09-12 VITALS — BP 119/70 | HR 105 | Temp 98.6°F | Resp 18 | Ht 73.0 in | Wt 178.0 lb

## 2016-09-12 DIAGNOSIS — Z09 Encounter for follow-up examination after completed treatment for conditions other than malignant neoplasm: Secondary | ICD-10-CM

## 2016-09-12 NOTE — Progress Notes (Signed)
Subjective:  Patient ID: Timothy Mcguire, male    DOB: [birth year]  Age: 26 y.o. MRN: [id number]  CC: Establish Care   HPI Timothy Mcguire presents for a note to return back to work. 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 He reports back in December of a spider bite which led to cellulitis and ED visit. He reports being referred to occupational health and then was referred here. He also brings an employment physical evaluation with him today and also reports additional paperwork that he needs to bring in. It was explained to patient that this was a follow-up appointment and he will need to schedule an appointment for an employment physical and to bring any additional paperwork he has in.    Outpatient Medications Prior to Visit  Medication Sig Dispense Refill  . benzonatate (TESSALON) 100 MG capsule Take 1 capsule (100 mg total) by mouth 3 (three) times daily as needed for cough. 20 capsule 0  . cephALEXin (KEFLEX) 250 MG capsule Take 1 capsule (250 mg total) by mouth 4 (four) times daily. 28 capsule 0  . erythromycin ophthalmic ointment Place a 1/2 inch ribbon of ointment into affect eye 4-6 times daily 1 g 0  . famotidine (PEPCID) 20 MG tablet Take 1 tablet (20 mg total) by mouth 2 (two) times daily. 14 tablet 0  . HYDROcodone-acetaminophen (NORCO/VICODIN) 5-325 MG tablet Take 1 tablet by mouth every 6 (six) hours as needed for severe pain. 5 tablet 0  . ibuprofen (ADVIL,MOTRIN) 200 MG tablet Take 200 mg by mouth every 6 (six) hours as needed for moderate pain.    Marland Kitchen lidocaine (XYLOCAINE) 2 % solution Use as directed 20 mLs in the mouth or throat as needed for mouth pain. 100 mL 0  . MELATONIN PO Take 12 mg by mouth daily as needed. For sleep    . naproxen (NAPROSYN) 500 MG tablet Take 1 tablet (500 mg total) by mouth 2 (two) times daily. (Patient not taking: Reported on 08/26/2015) 30 tablet 0  . oxyCODONE-acetaminophen (PERCOCET/ROXICET) 5-325 MG tablet Take 1 tablet by mouth every 6 (six)  hours as needed for severe pain. 5 tablet 0   No facility-administered medications prior to visit.     ROS Review of Systems  Respiratory: Negative.   Cardiovascular: Negative.   Skin:       History of spider bite.    Objective:  BP 119/70 (BP Location: Left Arm, Patient Position: Sitting, Cuff Size: Normal)   Pulse (!) 105   Temp 98.6 F (37 C) (Oral)   Resp 18   Ht 6\' 1"  (1.854 m)   Wt 178 lb (80.7 kg)   SpO2 97%   BMI 23.48 kg/m   BP/Weight 09/12/2016 08/09/2016 08/08/2016  Systolic BP 119 124 153  Diastolic BP 70 78 84  Wt. (Lbs) 178 - 180  BMI 23.48 - 23.75    Physical Exam  Cardiovascular: Normal rate, regular rhythm, normal heart sounds and intact distal pulses.   Pulmonary/Chest: Effort normal and breath sounds normal.  Abdominal: Soft. Bowel sounds are normal.  Skin: Skin is warm and dry.  Capillary refill within 3 seconds. Small circular brown lesion near 4th digit of left foot. No drainage or cellulitis present. No swelling.  Nursing note and vitals reviewed.    Assessment & Plan:   Problem List Items Addressed This Visit    None    Visit Diagnoses    Follow up    -  Primary  Follow-up: Return in about 1 week (around 09/19/2016) for Employment physical .   Lizbeth BarkMandesia R Nigel Wessman FNP

## 2016-09-12 NOTE — Patient Instructions (Addendum)
Schedule and appointment for this week or next employment physical.

## 2016-09-12 NOTE — Progress Notes (Signed)
Patient is here for establish care  Patient got bitten by a spider on Jul 29 2016  Patient is here for F/UP  Patient has not taking any meds today  Patient has eaten today

## 2016-09-21 ENCOUNTER — Encounter: Payer: Self-pay | Admitting: Family Medicine

## 2016-09-21 ENCOUNTER — Ambulatory Visit: Payer: Managed Care, Other (non HMO) | Attending: Family Medicine | Admitting: Family Medicine

## 2016-09-21 VITALS — BP 122/72 | HR 69 | Temp 98.5°F | Resp 18 | Ht 73.0 in | Wt 179.4 lb

## 2016-09-21 DIAGNOSIS — Z0289 Encounter for other administrative examinations: Secondary | ICD-10-CM

## 2016-09-21 DIAGNOSIS — G479 Sleep disorder, unspecified: Secondary | ICD-10-CM | POA: Diagnosis not present

## 2016-09-21 DIAGNOSIS — Z021 Encounter for pre-employment examination: Secondary | ICD-10-CM | POA: Diagnosis not present

## 2016-09-21 DIAGNOSIS — Z0000 Encounter for general adult medical examination without abnormal findings: Secondary | ICD-10-CM | POA: Diagnosis not present

## 2016-09-21 DIAGNOSIS — Z008 Encounter for other general examination: Secondary | ICD-10-CM | POA: Diagnosis present

## 2016-09-21 DIAGNOSIS — M25511 Pain in right shoulder: Secondary | ICD-10-CM | POA: Insufficient documentation

## 2016-09-21 DIAGNOSIS — Z8669 Personal history of other diseases of the nervous system and sense organs: Secondary | ICD-10-CM

## 2016-09-21 LAB — CBC WITH DIFFERENTIAL/PLATELET
Basophils Absolute: 0 cells/uL (ref 0–200)
Basophils Relative: 0 %
EOS ABS: 207 {cells}/uL (ref 15–500)
Eosinophils Relative: 3 %
HEMATOCRIT: 46.1 % (ref 38.5–50.0)
HEMOGLOBIN: 16.3 g/dL (ref 13.2–17.1)
LYMPHS ABS: 1173 {cells}/uL (ref 850–3900)
Lymphocytes Relative: 17 %
MCH: 31.9 pg (ref 27.0–33.0)
MCHC: 35.4 g/dL (ref 32.0–36.0)
MCV: 90.2 fL (ref 80.0–100.0)
MONO ABS: 414 {cells}/uL (ref 200–950)
MPV: 10.2 fL (ref 7.5–12.5)
Monocytes Relative: 6 %
NEUTROS ABS: 5106 {cells}/uL (ref 1500–7800)
Neutrophils Relative %: 74 %
Platelets: 229 10*3/uL (ref 140–400)
RBC: 5.11 MIL/uL (ref 4.20–5.80)
RDW: 13.6 % (ref 11.0–15.0)
WBC: 6.9 10*3/uL (ref 3.8–10.8)

## 2016-09-21 LAB — HEPATIC FUNCTION PANEL
ALT: 21 U/L (ref 9–46)
AST: 24 U/L (ref 10–40)
Albumin: 4.6 g/dL (ref 3.6–5.1)
Alkaline Phosphatase: 64 U/L (ref 40–115)
BILIRUBIN DIRECT: 0.2 mg/dL (ref <=0.2)
BILIRUBIN INDIRECT: 0.5 mg/dL (ref 0.2–1.2)
BILIRUBIN TOTAL: 0.7 mg/dL (ref 0.2–1.2)
TOTAL PROTEIN: 7.5 g/dL (ref 6.1–8.1)

## 2016-09-21 LAB — BASIC METABOLIC PANEL WITH GFR
BUN: 10 mg/dL (ref 7–25)
CHLORIDE: 102 mmol/L (ref 98–110)
CO2: 28 mmol/L (ref 20–31)
Calcium: 9.8 mg/dL (ref 8.6–10.3)
Creat: 1.18 mg/dL (ref 0.60–1.35)
GFR, EST NON AFRICAN AMERICAN: 86 mL/min (ref >=60)
GFR, Est African American: >89 mL/min (ref >=60)
GLUCOSE: 72 mg/dL (ref 65–99)
POTASSIUM: 4.1 mmol/L (ref 3.5–5.3)
Sodium: 139 mmol/L (ref 135–146)

## 2016-09-21 LAB — HEMOGLOBIN A1C
Hgb A1c MFr Bld: 4.2 % (ref <5.7)
MEAN PLASMA GLUCOSE: 74 mg/dL

## 2016-09-21 LAB — TSH: TSH: 1.41 m[IU]/L (ref 0.40–4.50)

## 2016-09-21 MED ORDER — TRAZODONE HCL 50 MG PO TABS: 25.0000 mg | ORAL_TABLET | Freq: Every evening | ORAL | 0 refills | Status: AC | PRN

## 2016-09-21 NOTE — Progress Notes (Signed)
Patient is here for employment physical  Patient denies pain today  Patient is not on any current meds  Patient has eaten today

## 2016-09-21 NOTE — Progress Notes (Signed)
Subjective:  Patient ID: Timothy SkeansAdam D Attig, male    DOB: 05/03/1991  Age: 26 y.o. MRN: [id number]  CC: Establish Care   HPI Timothy Skeansdam D Begeman presents for   1. Employment and  Annual physical: History of injury to the right ear 2 years ago. Reports dieting into a swimming pool right ear first. Denies any difficulty hearing, drainage, or tinnitus. Reports concern that eardrum was perforated.   2. Difficulty sleeping: He reports complaints of difficulty sleeping. He denies any SI/HI. Reports worry over the ability to keep his job as a stressor. He reports taking melatonin 5 mg with minimal relief of symptoms.  3. Right shoulder: Reports history of right shoulder injury while playing football in middle school. He reports no medical follow-up. He denies any decreased strength, tingling, numbness, or shoulder pain. He declines x-ray at this time.    Outpatient Medications Prior to Visit  Medication Sig Dispense Refill  . benzonatate (TESSALON) 100 MG capsule Take 1 capsule (100 mg total) by mouth 3 (three) times daily as needed for cough. (Patient not taking: Reported on 09/21/2016) 20 capsule 0  . cephALEXin (KEFLEX) 250 MG capsule Take 1 capsule (250 mg total) by mouth 4 (four) times daily. (Patient not taking: Reported on 09/21/2016) 28 capsule 0  . erythromycin ophthalmic ointment Place a 1/2 inch ribbon of ointment into affect eye 4-6 times daily (Patient not taking: Reported on 09/21/2016) 1 g 0  . famotidine (PEPCID) 20 MG tablet Take 1 tablet (20 mg total) by mouth 2 (two) times daily. (Patient not taking: Reported on 09/21/2016) 14 tablet 0  . HYDROcodone-acetaminophen (NORCO/VICODIN) 5-325 MG tablet Take 1 tablet by mouth every 6 (six) hours as needed for severe pain. (Patient not taking: Reported on 09/21/2016) 5 tablet 0  . ibuprofen (ADVIL,MOTRIN) 200 MG tablet Take 200 mg by mouth every 6 (six) hours as needed for moderate pain.    Marland Kitchen. lidocaine (XYLOCAINE) 2 % solution Use as directed 20  mLs in the mouth or throat as needed for mouth pain. (Patient not taking: Reported on 09/21/2016) 100 mL 0  . MELATONIN PO Take 12 mg by mouth daily as needed. For sleep    . naproxen (NAPROSYN) 500 MG tablet Take 1 tablet (500 mg total) by mouth 2 (two) times daily. (Patient not taking: Reported on 08/26/2015) 30 tablet 0  . oxyCODONE-acetaminophen (PERCOCET/ROXICET) 5-325 MG tablet Take 1 tablet by mouth every 6 (six) hours as needed for severe pain. (Patient not taking: Reported on 09/21/2016) 5 tablet 0   No facility-administered medications prior to visit.     ROS Review of Systems  Constitutional: Negative.   HENT: Negative.        History of perforated ear drum.  Eyes: Negative.   Respiratory: Negative.   Cardiovascular: Negative.   Gastrointestinal: Negative.   Endocrine: Negative.   Musculoskeletal: Negative.        History of right shoulder injury in middle school.  Skin: Negative.   Allergic/Immunologic: Negative.   Neurological: Negative.   Hematological: Negative.   Psychiatric/Behavioral: Positive for sleep disturbance.       Objective:  BP 122/72 (BP Location: Left Arm, Patient Position: Sitting, Cuff Size: Normal)   Pulse 69   Temp 98.5 F (36.9 C) (Oral)   Resp 18   Ht 6\' 1"  (1.854 m)   Wt 179 lb 6.4 oz (81.4 kg)   SpO2 98%   BMI 23.67 kg/m   BP/Weight 09/21/2016 09/12/2016 08/09/2016  Systolic BP 122  119 124  Diastolic BP 72 70 78  Wt. (Lbs) 179.4 178 -  BMI 23.67 23.48 -     Physical Exam  Constitutional: He is oriented to person, place, and time. He appears well-developed and well-nourished.  HENT:  Head: Normocephalic and atraumatic.  Right Ear: External ear normal.  Left Ear: External ear normal.  Nose: Nose normal.  Mouth/Throat: Oropharynx is clear and moist.  Right ear- perforated tympanic membrane. No drainage, inflammation, or erythema present.  Eyes: Conjunctivae and EOM are normal. Pupils are equal, round, and reactive to light.  Neck:  Normal range of motion. Neck supple.  Cardiovascular: Normal rate, regular rhythm, normal heart sounds and intact distal pulses.   Pulmonary/Chest: Effort normal and breath sounds normal.  Abdominal: Soft. Bowel sounds are normal.  Musculoskeletal: Normal range of motion.  Neurological: He is alert and oriented to person, place, and time. He has normal reflexes.  Skin: Skin is warm and dry.  Psychiatric: He has a normal mood and affect. His behavior is normal. Judgment and thought content normal.  Nursing note and vitals reviewed.   Assessment & Plan:   Problem List Items Addressed This Visit    None    Visit Diagnoses    Annual physical exam    -  Primary   Relevant Orders   BASIC METABOLIC PANEL WITH GFR (Completed)   CBC with Differential (Completed)   TSH (Completed)   Vitamin D, 25-hydroxy (Completed)   Hepatic Function Panel (Completed)   Hemoglobin A1c (Completed)   Encounter for physical examination related to employment       History of perforated ear drum       Relevant Orders   Ambulatory referral to ENT   Difficulty sleeping       Relevant Medications   traZODone (DESYREL) 50 MG tablet      Meds ordered this encounter  Medications  . traZODone (DESYREL) 50 MG tablet    Sig: Take 0.5-1 tablets (25-50 mg total) by mouth at bedtime as needed for sleep.    Dispense:  30 tablet    Refill:  0    Order Specific Question:   Supervising Provider    Answer:   Quentin Angst [[id number]]    Follow-up: Return in about 1 year (around 09/21/2017) for Physical .   Lizbeth Bark FNP

## 2016-09-21 NOTE — Patient Instructions (Signed)
Exercising to Stay Healthy Introduction Exercising regularly is important. It has many health benefits, such as:  Improving your overall fitness, flexibility, and endurance.  Increasing your bone density.  Helping with weight control.  Decreasing your body fat.  Increasing your muscle strength.  Reducing stress and tension.  Improving your overall health. In order to become healthy and stay healthy, it is recommended that you do moderate-intensity and vigorous-intensity exercise. You can tell that you are exercising at a moderate intensity if you have a higher heart rate and faster breathing, but you are still able to hold a conversation. You can tell that you are exercising at a vigorous intensity if you are breathing much harder and faster and cannot hold a conversation while exercising. How often should I exercise? Choose an activity that you enjoy and set realistic goals. Your health care provider can help you to make an activity plan that works for you. Exercise regularly as directed by your health care provider. This may include:  Doing resistance training twice each week, such as:  Push-ups.  Sit-ups.  Lifting weights.  Using resistance bands.  Doing a given intensity of exercise for a given amount of time. Choose from these options:  150 minutes of moderate-intensity exercise every week.  75 minutes of vigorous-intensity exercise every week.  A mix of moderate-intensity and vigorous-intensity exercise every week. Children, pregnant women, people who are out of shape, people who are overweight, and older adults may need to consult a health care provider for individual recommendations. If you have any sort of medical condition, be sure to consult your health care provider before starting a new exercise program. What are some exercise ideas? Some moderate-intensity exercise ideas include:  Walking at a rate of 1 mile in 15  minutes.  Biking.  Hiking.  Golfing.  Dancing. Some vigorous-intensity exercise ideas include:  Walking at a rate of at least 4.5 miles per hour.  Jogging or running at a rate of 5 miles per hour.  Biking at a rate of at least 10 miles per hour.  Lap swimming.  Roller-skating or in-line skating.  Cross-country skiing.  Vigorous competitive sports, such as football, basketball, and soccer.  Jumping rope.  Aerobic dancing. What are some everyday activities that can help me to get exercise?  Yard work, such as:  Pushing a lawn mower.  Raking and bagging leaves.  Washing and waxing your car.  Pushing a stroller.  Shoveling snow.  Gardening.  Washing windows or floors. How can I be more active in my day-to-day activities?  Use the stairs instead of the elevator.  Take a walk during your lunch break.  If you drive, park your car farther away from work or school.  If you take public transportation, get off one stop early and walk the rest of the way.  Make all of your phone calls while standing up and walking around.  Get up, stretch, and walk around every 30 minutes throughout the day. What guidelines should I follow while exercising?  Do not exercise so much that you hurt yourself, feel dizzy, or get very short of breath.  Consult your health care provider before starting a new exercise program.  Wear comfortable clothes and shoes with good support.  Drink plenty of water while you exercise to prevent dehydration or heat stroke. Body water is lost during exercise and must be replaced.  Work out until you breathe faster and your heart beats faster. This information is not intended to replace   advice given to you by your health care provider. Make sure you discuss any questions you have with your health care provider. Document Released: 08/20/2010 Document Revised: 12/24/2015 Document Reviewed: 12/19/2013  2017 [employer]vier  

## 2016-09-22 LAB — VITAMIN D 25 HYDROXY (VIT D DEFICIENCY, FRACTURES): VIT D 25 HYDROXY: 25 ng/mL — AB (ref 30–100)

## 2016-09-26 ENCOUNTER — Telehealth: Payer: Self-pay | Admitting: Family Medicine

## 2016-09-26 NOTE — Telephone Encounter (Signed)
Patient came by the office to drop off FMLA documentation for PCP to fill out. Placing in provider's mailbox.   Thank you.

## 2016-09-26 NOTE — Telephone Encounter (Signed)
Ok, Thanks

## 2016-10-03 ENCOUNTER — Other Ambulatory Visit: Payer: Self-pay | Admitting: Family Medicine

## 2016-10-03 DIAGNOSIS — E559 Vitamin D deficiency, unspecified: Secondary | ICD-10-CM

## 2016-10-03 MED ORDER — CALCIUM 600-400 MG-UNIT PO CHEW: 1.0000 | CHEWABLE_TABLET | Freq: Every day | ORAL | 2 refills | Status: AC

## 2016-10-03 NOTE — Telephone Encounter (Signed)
-----   Message from Lizbeth BarkMandesia R Hairston, FNP sent at 10/03/2016  8:46 AM EST ----- Vitamin D level was low. Vitamin D helps to keep bones strong. You were prescribed a vitamin-d supplement to increase your levels. HgbA1c is 4.2  which is normal .  HgbA1c is used to screen for diabetes levels of 6.5 or greater indicates diabetes Thyroid function normal Kidney function normal Liver function normal Labs normal.

## 2016-10-03 NOTE — Telephone Encounter (Signed)
Cma call to go over lab results  Patient did not answer but CMA left a VM stating the results in the VM

## 2016-10-04 ENCOUNTER — Other Ambulatory Visit: Payer: Self-pay | Admitting: Family Medicine

## 2016-10-04 NOTE — Telephone Encounter (Signed)
CMA call to inform that his FMLA paperwork is ready to pick up & to inform him about lab results  Patient stated that he out of town for two weeks & when he gets back he will come & pick it up  Patient also was aware and understood his lab results

## 2016-10-04 NOTE — Telephone Encounter (Signed)
-----   Message from Lizbeth BarkMandesia R Hairston, FNP sent at 10/03/2016  8:46 AM EST ----- When calling patient with his labs results. Please notify him also  that his FMLA paperwork will be available for pick up by tomorrow morning.

## 2017-02-28 ENCOUNTER — Encounter (HOSPITAL_COMMUNITY): Payer: Self-pay | Admitting: Emergency Medicine

## 2017-02-28 ENCOUNTER — Emergency Department (HOSPITAL_COMMUNITY)
Admission: EM | Admit: 2017-02-28 | Discharge: 2017-02-28 | Disposition: A | Discharge status: Home / Self Care | Payer: Managed Care, Other (non HMO) | Source: Home / Self Care

## 2017-02-28 DIAGNOSIS — Z5321 Procedure and treatment not carried out due to patient leaving prior to being seen by health care provider: Secondary | ICD-10-CM | POA: Insufficient documentation

## 2017-02-28 DIAGNOSIS — K602 Anal fissure, unspecified: Secondary | ICD-10-CM | POA: Insufficient documentation

## 2017-02-28 DIAGNOSIS — Z79899 Other long term (current) drug therapy: Secondary | ICD-10-CM | POA: Insufficient documentation

## 2017-02-28 DIAGNOSIS — R1030 Lower abdominal pain, unspecified: Secondary | ICD-10-CM | POA: Diagnosis present

## 2017-02-28 DIAGNOSIS — R195 Other fecal abnormalities: Secondary | ICD-10-CM

## 2017-02-28 LAB — COMPREHENSIVE METABOLIC PANEL
ALK PHOS: 55 U/L (ref 38–126)
ALT: 50 U/L (ref 17–63)
ANION GAP: 10 (ref 5–15)
AST: 50 U/L — ABNORMAL HIGH (ref 15–41)
Albumin: 4.2 g/dL (ref 3.5–5.0)
BILIRUBIN TOTAL: 0.7 mg/dL (ref 0.3–1.2)
BUN: 10 mg/dL (ref 6–20)
CALCIUM: 9.5 mg/dL (ref 8.9–10.3)
CO2: 26 mmol/L (ref 22–32)
Chloride: 103 mmol/L (ref 101–111)
Creatinine, Ser: 1.3 mg/dL — ABNORMAL HIGH (ref 0.61–1.24)
GFR calc non Af Amer: >60 mL/min (ref >60)
Glucose, Bld: 69 mg/dL (ref 65–99)
Potassium: 3.8 mmol/L (ref 3.5–5.1)
Sodium: 139 mmol/L (ref 135–145)
TOTAL PROTEIN: 7.2 g/dL (ref 6.5–8.1)

## 2017-02-28 LAB — CBC
HCT: 47.3 % (ref 39.0–52.0)
HEMOGLOBIN: 16.2 g/dL (ref 13.0–17.0)
MCH: 29.9 pg (ref 26.0–34.0)
MCHC: 34.2 g/dL (ref 30.0–36.0)
MCV: 87.3 fL (ref 78.0–100.0)
Platelets: 282 10*3/uL (ref 150–400)
RBC: 5.42 MIL/uL (ref 4.22–5.81)
RDW: 14.3 % (ref 11.5–15.5)
WBC: 6.6 10*3/uL (ref 4.0–10.5)

## 2017-02-28 LAB — TYPE AND SCREEN
ABO/RH(D): O NEG
Antibody Screen: NEGATIVE

## 2017-02-28 LAB — ABO/RH: ABO/RH(D): O NEG

## 2017-02-28 NOTE — ED Notes (Signed)
Called pt to re-assess vitals, pt did not answer.  

## 2017-02-28 NOTE — ED Notes (Signed)
Called for triage X1 

## 2017-02-28 NOTE — ED Notes (Signed)
Patient updated on delays and thanked this RN for time

## 2017-02-28 NOTE — ED Triage Notes (Signed)
See previous noted, pt LWBS. Has had blood in stool for years.

## 2017-02-28 NOTE — ED Notes (Signed)
Called pt x2 for vitals recheck, pt did not answer.

## 2017-02-28 NOTE — ED Triage Notes (Signed)
Patients present with statement of "passing blood with stool" for a long time. Some pain with passing stool.

## 2017-03-01 ENCOUNTER — Emergency Department (HOSPITAL_COMMUNITY): Payer: Managed Care, Other (non HMO)

## 2017-03-01 ENCOUNTER — Emergency Department (HOSPITAL_COMMUNITY)
Admission: EM | Admit: 2017-03-01 | Discharge: 2017-03-01 | Disposition: A | Discharge status: Home / Self Care | Payer: Managed Care, Other (non HMO) | Source: Home / Self Care

## 2017-03-01 ENCOUNTER — Encounter (HOSPITAL_COMMUNITY): Payer: Self-pay | Admitting: *Deleted

## 2017-03-01 ENCOUNTER — Emergency Department (HOSPITAL_COMMUNITY)
Admission: EM | Admit: 2017-03-01 | Discharge: 2017-03-01 | Disposition: A | Discharge status: Home / Self Care | Payer: Managed Care, Other (non HMO) | Attending: Emergency Medicine | Admitting: Emergency Medicine

## 2017-03-01 DIAGNOSIS — K602 Anal fissure, unspecified: Secondary | ICD-10-CM

## 2017-03-01 LAB — BASIC METABOLIC PANEL
Anion gap: 7 (ref 5–15)
BUN: 5 mg/dL — ABNORMAL LOW (ref 6–20)
CHLORIDE: 103 mmol/L (ref 101–111)
CO2: 29 mmol/L (ref 22–32)
Calcium: 9.6 mg/dL (ref 8.9–10.3)
Creatinine, Ser: 1.12 mg/dL (ref 0.61–1.24)
Glucose, Bld: 83 mg/dL (ref 65–99)
POTASSIUM: 4.1 mmol/L (ref 3.5–5.1)
SODIUM: 139 mmol/L (ref 135–145)

## 2017-03-01 LAB — CBC WITH DIFFERENTIAL/PLATELET
BASOS PCT: 1 %
Basophils Absolute: 0 10*3/uL (ref 0.0–0.1)
EOS ABS: 0.3 10*3/uL (ref 0.0–0.7)
EOS PCT: 5 %
HCT: 45.7 % (ref 39.0–52.0)
HEMOGLOBIN: 15.9 g/dL (ref 13.0–17.0)
LYMPHS ABS: 1.7 10*3/uL (ref 0.7–4.0)
Lymphocytes Relative: 26 %
MCH: 30.2 pg (ref 26.0–34.0)
MCHC: 34.8 g/dL (ref 30.0–36.0)
MCV: 86.9 fL (ref 78.0–100.0)
Monocytes Absolute: 0.3 10*3/uL (ref 0.1–1.0)
Monocytes Relative: 5 %
NEUTROS PCT: 63 %
Neutro Abs: 4.1 10*3/uL (ref 1.7–7.7)
PLATELETS: 277 10*3/uL (ref 150–400)
RBC: 5.26 MIL/uL (ref 4.22–5.81)
RDW: 14.2 % (ref 11.5–15.5)
WBC: 6.4 10*3/uL (ref 4.0–10.5)

## 2017-03-01 MED ORDER — IOPAMIDOL (ISOVUE-300) INJECTION 61%
INTRAVENOUS | Status: AC
  Administered 2017-03-01: 100 mL
  Filled 2017-03-01: qty 100

## 2017-03-01 MED ORDER — DOCUSATE SODIUM 250 MG PO CAPS: 250.0000 mg | ORAL_CAPSULE | Freq: Every day | ORAL | 0 refills | Status: AC

## 2017-03-01 NOTE — ED Notes (Signed)
Patient did not answer when called for vitals reassess.  Patient noted to have left to go outside over an hour ago.  Will d/c.

## 2017-03-01 NOTE — Discharge Instructions (Signed)
Your blood work and CT scan are normal. Start taking a stool softener for the anal fissure. Sitz baths several times a day. Follow with gastroenterology if continued to have bleeding.

## 2017-03-01 NOTE — ED Triage Notes (Signed)
Pt reports ongoing rectal bleeding and abd pain for years but has become more severe. Reports feeling like he needs to have a bowel movement and will pass only blood. No acute distress is noted at triage. Was here yesterday and had blood work done then left without being seen.

## 2017-03-01 NOTE — ED Provider Notes (Signed)
[facility]-EMERGENCY DEPT Provider Note   CSN: [id number] Arrival date & time: 03/01/17  1338     History   Chief Complaint Chief Complaint  Patient presents with  . Abdominal Pain    HPI Timothy Mcguire is a 26 y.o. male.  HPI Timothy Mcguire is a 26 y.o. male presents to emergency department with complaint of rectal bleeding and abdominal pain. Patient states he has had intermittent similar symptoms for years, has never seen a physician. States that this episode started yesterday. He reports urge to go to the bathroom but states when he has a bowel movement only blood comes out. He reports lower abdominal cramping associated with this bleeding. Denies any urinary symptoms. No fever or chills. No nausea or vomiting. He went to the ER yesterday but left without being seen due to long wait. Tried any medications. He denies any dizziness or lightheadedness.  History reviewed. No pertinent past medical history.  There are no active problems to display for this patient.   Past Surgical History:  Procedure Laterality Date  . TONGUE SURGERY         Home Medications    Prior to Admission medications   Medication Sig Start Date End Date Taking? Authorizing Provider  benzonatate (TESSALON) 100 MG capsule Take 1 capsule (100 mg total) by mouth 3 (three) times daily as needed for cough. Patient not taking: Reported on 09/21/2016 09/07/15   Catha GosselinPatel-Mills, Hanna, PA-C  Calcium 600-400 MG-UNIT CHEW Chew 1 tablet by mouth daily. 10/03/16   Lizbeth BarkHairston, Mandesia R, FNP  cephALEXin (KEFLEX) 250 MG capsule Take 1 capsule (250 mg total) by mouth 4 (four) times daily. Patient not taking: Reported on 09/21/2016 07/29/16   Roxy HorsemanBrowning, Robert, PA-C  erythromycin ophthalmic ointment Place a 1/2 inch ribbon of ointment into affect eye 4-6 times daily Patient not taking: Reported on 09/21/2016 10/01/15   Ward, Chase PicketJaime Pilcher, PA-C  famotidine (PEPCID) 20 MG tablet Take 1 tablet (20 mg total) by mouth 2 (two) times  daily. Patient not taking: Reported on 09/21/2016 08/27/15   Linwood DibblesKnapp, Jon, MD  HYDROcodone-acetaminophen (NORCO/VICODIN) 5-325 MG tablet Take 1 tablet by mouth every 6 (six) hours as needed for severe pain. Patient not taking: Reported on 09/21/2016 08/03/16   Earley FavorSchulz, Gail, NP  ibuprofen (ADVIL,MOTRIN) 200 MG tablet Take 200 mg by mouth every 6 (six) hours as needed for moderate pain.    [provider]  lidocaine (XYLOCAINE) 2 % solution Use as directed 20 mLs in the mouth or throat as needed for mouth pain. Patient not taking: Reported on 09/21/2016 09/07/15   Catha GosselinPatel-Mills, Hanna, PA-C  MELATONIN PO Take 12 mg by mouth daily as needed. For sleep    [provider]  naproxen (NAPROSYN) 500 MG tablet Take 1 tablet (500 mg total) by mouth 2 (two) times daily. Patient not taking: Reported on 08/26/2015 05/27/15   Janne NapoleonNeese, Hope M, NP  oxyCODONE-acetaminophen (PERCOCET/ROXICET) 5-325 MG tablet Take 1 tablet by mouth every 6 (six) hours as needed for severe pain. Patient not taking: Reported on 09/21/2016 11/01/15   Mackuen, Cindee Saltourteney Lyn, MD  traZODone (DESYREL) 50 MG tablet Take 0.5-1 tablets (25-50 mg total) by mouth at bedtime as needed for sleep. 09/21/16   Lizbeth BarkHairston, Mandesia R, FNP    Family History History reviewed. No pertinent family history.  Social History Social History  Substance Use Topics  . Smoking status: Never Smoker  . Smokeless tobacco: Never Used  . Alcohol use No     Allergies  Tramadol   Review of Systems Review of Systems  Constitutional: Negative for chills and fever.  Respiratory: Negative for cough, chest tightness and shortness of breath.   Cardiovascular: Negative for chest pain, palpitations and leg swelling.  Gastrointestinal: Positive for abdominal pain and blood in stool. Negative for abdominal distention, diarrhea, nausea and vomiting.  Genitourinary: Negative for dysuria, frequency, hematuria and urgency.  Musculoskeletal: Negative for arthralgias,  myalgias, neck pain and neck stiffness.  Skin: Negative for rash.  Allergic/Immunologic: Negative for immunocompromised state.  Neurological: Negative for dizziness, weakness, light-headedness, numbness and headaches.  All other systems reviewed and are negative.    Physical Exam Updated Vital Signs BP (!) 141/84   Pulse 69   Temp 98.4 F (36.9 C) (Oral)   Resp 19   SpO2 98%   Physical Exam  Constitutional: He appears well-developed and well-nourished. No distress.  HENT:  Head: Normocephalic and atraumatic.  Eyes: Conjunctivae are normal.  Neck: Neck supple.  Cardiovascular: Normal rate, regular rhythm and normal heart sounds.   Pulmonary/Chest: Effort normal. No respiratory distress. He has no wheezes. He has no rales.  Abdominal: Soft. Bowel sounds are normal. He exhibits no distension. There is tenderness. There is no rebound.  Suprapubic tenderness  Genitourinary:  Genitourinary Comments: No hemorrhoids. Rectal fissure present. No active bleeding.  Musculoskeletal: He exhibits no edema.  Neurological: He is alert.  Skin: Skin is warm and dry.  Nursing note and vitals reviewed.    ED Treatments / Results  Labs (all labs ordered are listed, but only abnormal results are displayed) Labs Reviewed  BASIC METABOLIC PANEL - Abnormal; Notable for the following:       Result Value   BUN 5 (*)    All other components within normal limits  CBC WITH DIFFERENTIAL/PLATELET    EKG  EKG Interpretation None       Radiology Ct Abdomen Pelvis W Contrast  Result Date: 03/01/2017 CLINICAL DATA:  Abdominal pain, rectal bleeding EXAM: CT ABDOMEN AND PELVIS WITH CONTRAST TECHNIQUE: Multidetector CT imaging of the abdomen and pelvis was performed using the standard protocol following bolus administration of intravenous contrast. CONTRAST:  100mL ISOVUE-300 IOPAMIDOL (ISOVUE-300) INJECTION 61% COMPARISON:  None. FINDINGS: Lower chest: Lung bases are clear. Hepatobiliary: Liver is  within normal limits. Gallbladder is within normal limits. No intrahepatic or extrahepatic duct dilatation. Pancreas: Within normal limits. Spleen: Within normal limits. Adrenals/Urinary Tract: Adrenal glands within normal limits. Kidneys are within normal limits.  No hydronephrosis. Bladder is within normal limits. Stomach/Bowel: Stomach is within normal limits. No evidence of bowel obstruction. Normal appendix (series 3/image 56). No colonic wall thickening or inflammatory changes. Vascular/Lymphatic: No evidence of abdominal aortic aneurysm. No suspicious abdominopelvic lymphadenopathy. Reproductive: Prostate is unremarkable. Other: No abdominopelvic ascites. Musculoskeletal: Visualized osseous structures are within normal limits. IMPRESSION: Normal CT abdomen/pelvis. Electronically Signed   By: Charline BillsSriyesh  Krishnan M.D.   On: 03/01/2017 18:28    Procedures Procedures (including critical care time)  Medications Ordered in ED Medications  iopamidol (ISOVUE-300) 61 % injection (100 mLs  Contrast Given 03/01/17 1813)     Initial Impression / Assessment and Plan / ED Course  I have reviewed the triage vital signs and the nursing notes.  Pertinent labs & imaging results that were available during my care of the patient were reviewed by me and considered in my medical decision making (see chart for details).     Patient in emergency department with lower abdominal pain and rectal bleeding. Abdomen is mildly  tender in the suprapubic area. Will get labs, will get CT abdomen and pelvis to rule out colitis/ IBD.   6:50 PM Labs normal. CT negative. Patient is nontoxic. Vital signs are normal. Hemoglobin practically unchanged since yesterday. Patient does have a fissure on exam which is most likely what is causing his bleeding. I cannot explain his abdominal pain. I will discharge him home with a gastroenterology follow-up. Return precautions discussed. Discussed treatment for anal fissure which is sitz  baths and stool softeners.  Vitals:   03/01/17 1342 03/01/17 1639  BP: (!) 152/92 (!) 141/84  Pulse: 90 69  Resp: 17 19  Temp: 98 F (36.7 C) 98.4 F (36.9 C)  TempSrc: Oral Oral  SpO2: 100% 98%     Final Clinical Impressions(s) / ED Diagnoses   Final diagnoses:  Rectal fissure    New Prescriptions New Prescriptions   DOCUSATE SODIUM (COLACE) 250 MG CAPSULE    Take 1 capsule (250 mg total) by mouth daily.     Jaynie Crumble, PA-C 03/01/17 1857    Doug Sou, MD 03/02/17 [phone]

## 2017-03-01 NOTE — ED Notes (Signed)
Declined W/C at D/C and was escorted to lobby by RN. 

## 2017-08-21 IMAGING — CT CT ABD-PELV W/ CM
2 of 4 series · 16 of 46 positions shown, 18 images · IV contrast (APPLIED)
Comparison: None.

CLINICAL DATA: Abdominal pain, rectal bleeding

EXAM:
CT ABDOMEN AND PELVIS WITH CONTRAST
TECHNIQUE: Multidetector CT imaging of the abdomen and pelvis was performed
using the standard protocol following bolus administration of
intravenous contrast.
CONTRAST:  100mL P1RBIU-4TT IOPAMIDOL (P1RBIU-4TT) INJECTION 61%

[Series 3: abd/ pelvis 5.0 i30f 2 · axial · 0.79mm/px · z∈[+822,+1262]mm · 13 of 96 slices shown, 15 images]
[im 4/96  soft-tissue]
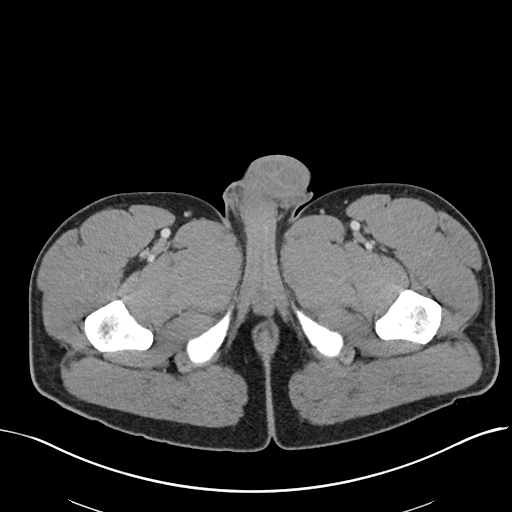
[im 4/96  bone]
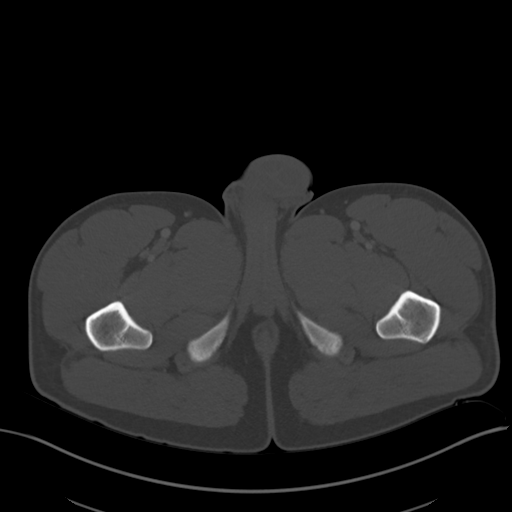
[im 11/96  soft-tissue]
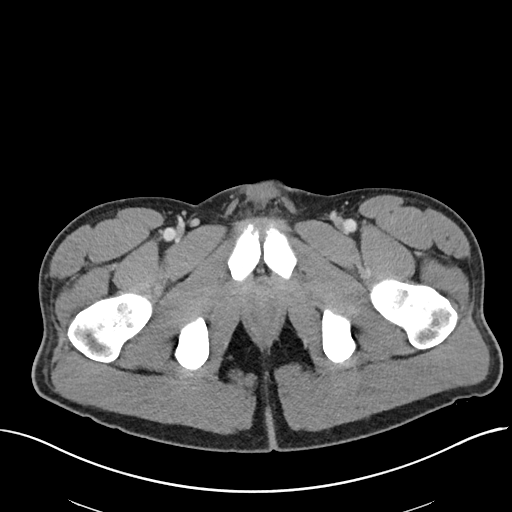
[im 19/96  soft-tissue]
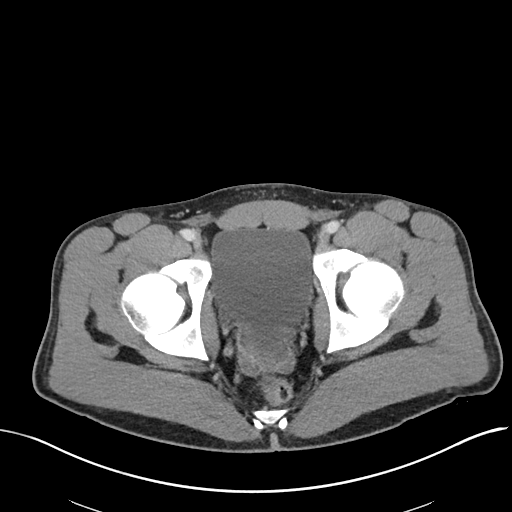
[im 26/96  soft-tissue]
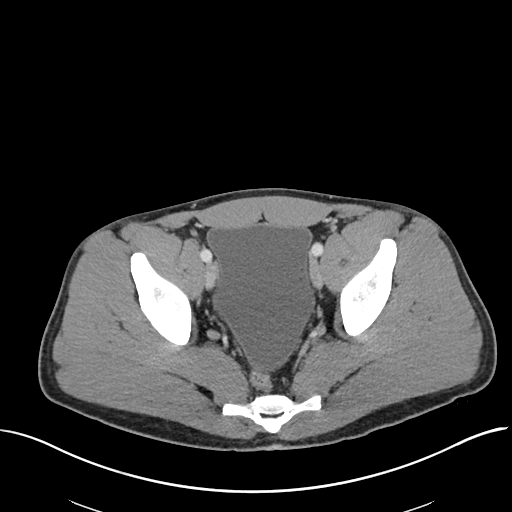
[im 33/96  soft-tissue]
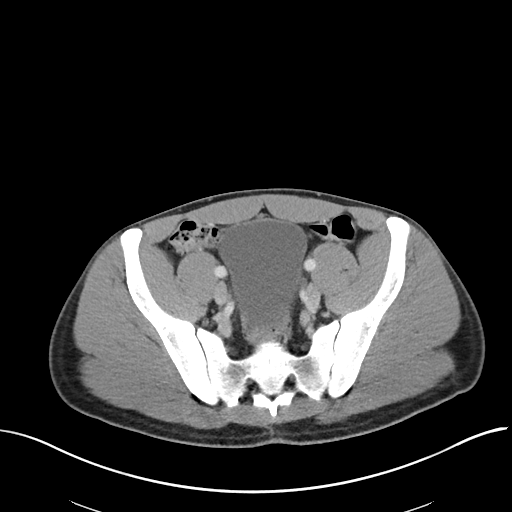
[im 41/96  soft-tissue]
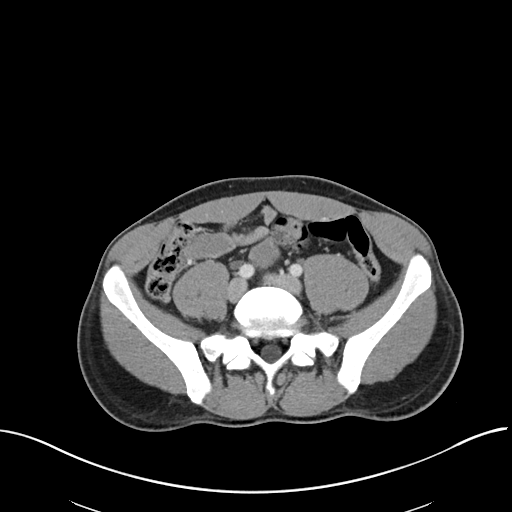
[im 48/96  soft-tissue]
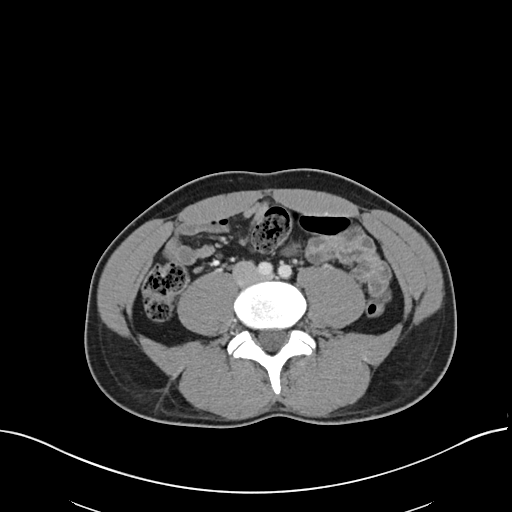
[im 55/96  soft-tissue]
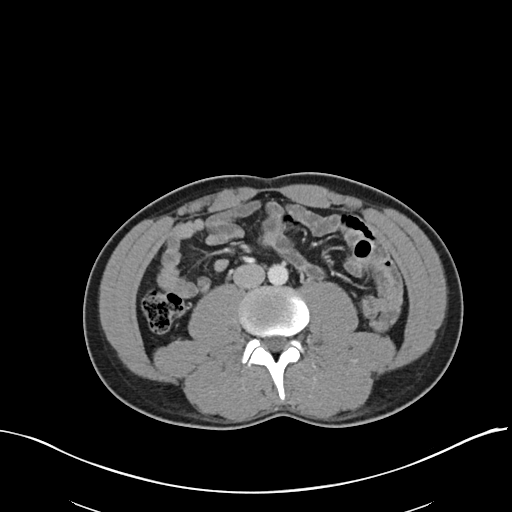
[im 63/96  soft-tissue]
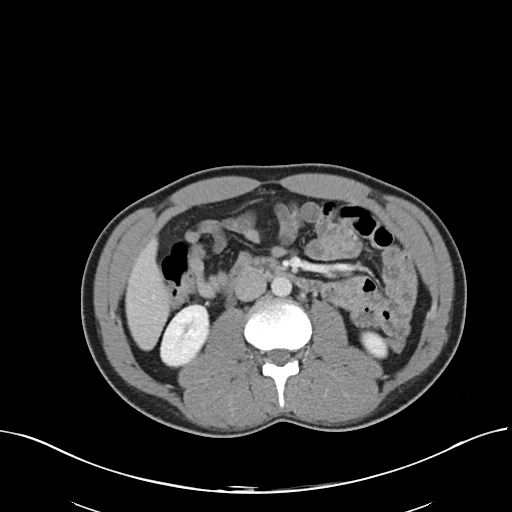
[im 63/96  bone]
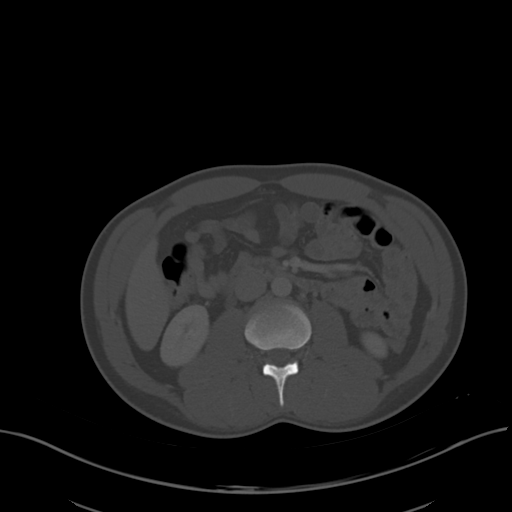
[im 70/96  soft-tissue]
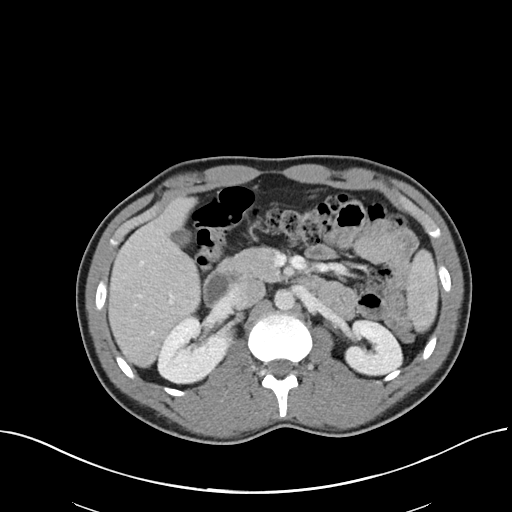
[im 77/96  soft-tissue]
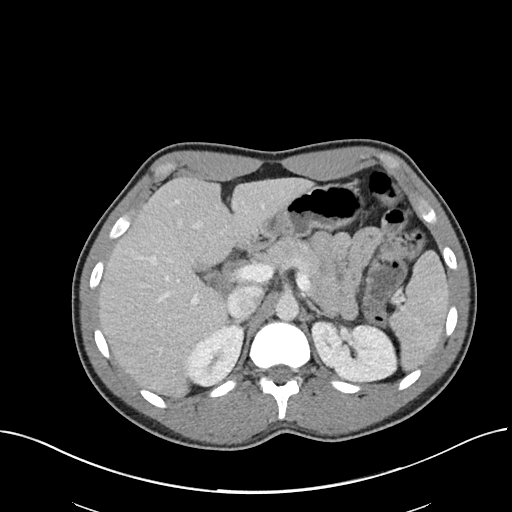
[im 85/96  soft-tissue]
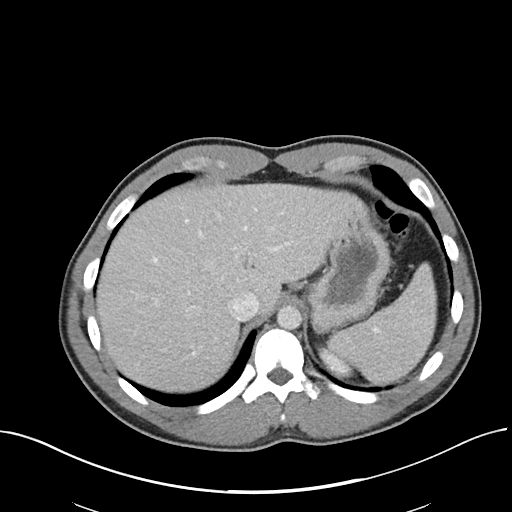
[im 92/96  soft-tissue]
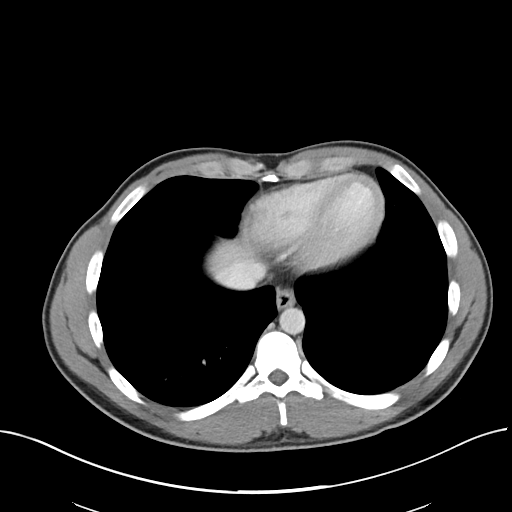

[Series 6: coronal soft tissue · coronal · 0.74mm/px · 3 of 101 slices shown]
[im 34/101  soft-tissue]
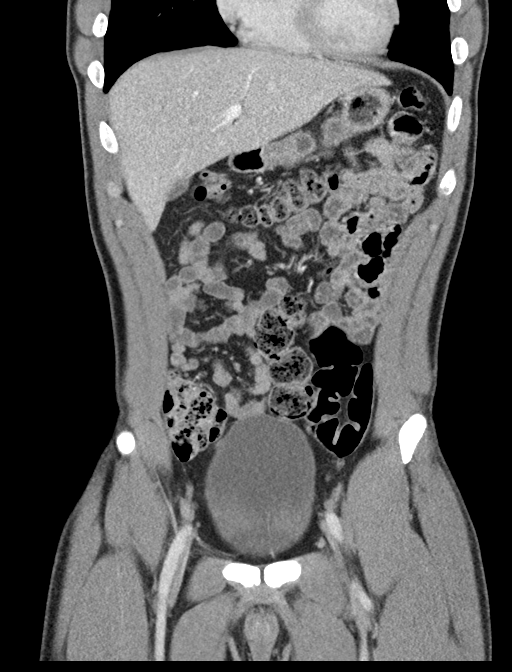
[im 45/101  soft-tissue]
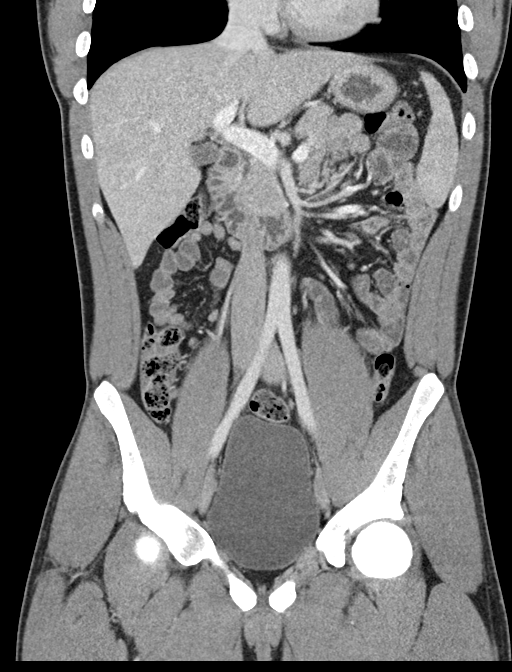
[im 56/101  soft-tissue]
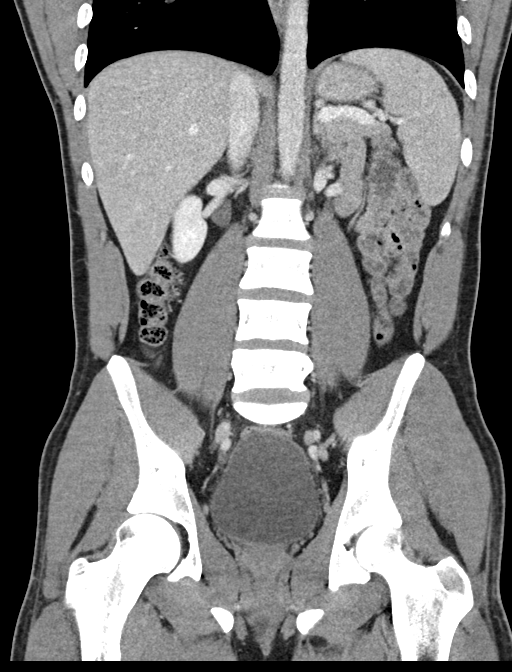

[16 of 46 positions shown; findings below may reference images not displayed]

FINDINGS: Lower chest: Lung bases are clear.

Hepatobiliary: Liver is within normal limits.

Gallbladder is within normal limits. No intrahepatic or extrahepatic
duct dilatation.

Pancreas: Within normal limits.

Spleen: Within normal limits.

Adrenals/Urinary Tract: Adrenal glands within normal limits.

Kidneys are within normal limits.  No hydronephrosis.

Bladder is within normal limits.

Stomach/Bowel: Stomach is within normal limits.

No evidence of bowel obstruction.

Normal appendix (series 3/image 56).

No colonic wall thickening or inflammatory changes.

Vascular/Lymphatic: No evidence of abdominal aortic aneurysm.

No suspicious abdominopelvic lymphadenopathy.

Reproductive: Prostate is unremarkable.

Other: No abdominopelvic ascites.

Musculoskeletal: Visualized osseous structures are within normal
limits.
IMPRESSION: Normal CT abdomen/pelvis.

## 2019-11-01 ENCOUNTER — Ambulatory Visit: Payer: Managed Care, Other (non HMO) | Attending: Internal Medicine

## 2019-11-01 DIAGNOSIS — Z23 Encounter for immunization: Secondary | ICD-10-CM

## 2019-11-01 NOTE — Progress Notes (Signed)
   Covid-19 Vaccination Clinic  Name:  Timothy Mcguire    MRN: [id number] DOB: 06-17-1991  11/01/2019  [name] was observed post Covid-19 immunization for 15 minutes without incident. He was provided with Vaccine Information Sheet and instruction to access the V-Safe system.   [name] was instructed to call 911 with any severe reactions post vaccine: Marland Kitchen Difficulty breathing  . Swelling of face and throat  . A fast heartbeat  . A bad rash all over body  . Dizziness and weakness   Immunizations Administered    Name Date Dose VIS Date Route   Pfizer COVID-19 Vaccine 11/01/2019 12:08 PM 0.3 mL 07/12/2019 Intramuscular   Manufacturer: [employer], [employer]   Lot: [id number]   NDC: [address]-7

## 2019-11-02 ENCOUNTER — Ambulatory Visit: Payer: Managed Care, Other (non HMO)

## 2019-11-25 ENCOUNTER — Ambulatory Visit: Payer: Managed Care, Other (non HMO) | Attending: Internal Medicine

## 2019-11-25 DIAGNOSIS — Z23 Encounter for immunization: Secondary | ICD-10-CM

## 2019-11-25 NOTE — Progress Notes (Signed)
   Covid-19 Vaccination Clinic  Name:  Timothy Mcguire    MRN: [id number] DOB: [birth year]  11/25/2019  Mr. Timothy Mcguire was observed post Covid-19 immunization for 15 minutes without incident. He was provided with Vaccine Information Sheet and instruction to access the V-Safe system.   Mr. Timothy Mcguire was instructed to call 911 with any severe reactions post vaccine: Marland Kitchen Difficulty breathing  . Swelling of face and throat  . A fast heartbeat  . A bad rash all over body  . Dizziness and weakness   Immunizations Administered    Name Date Dose VIS Date Route   Pfizer COVID-19 Vaccine 11/25/2019  1:28 PM 0.3 mL 09/25/2018 Intramuscular   Manufacturer: [employer], [employer]   Lot: [id number]   NDC: [address]-9

## 2021-10-11 ENCOUNTER — Emergency Department (HOSPITAL_COMMUNITY)
Admission: EM | Admit: 2021-10-11 | Discharge: 2021-10-11 | Disposition: A | Discharge status: Home / Self Care | Payer: Managed Care, Other (non HMO) | Attending: Student | Admitting: Student

## 2021-10-11 ENCOUNTER — Emergency Department (HOSPITAL_COMMUNITY): Payer: Managed Care, Other (non HMO)

## 2021-10-11 ENCOUNTER — Encounter (HOSPITAL_COMMUNITY): Payer: Self-pay

## 2021-10-11 DIAGNOSIS — Y9389 Activity, other specified: Secondary | ICD-10-CM | POA: Insufficient documentation

## 2021-10-11 DIAGNOSIS — R0781 Pleurodynia: Secondary | ICD-10-CM | POA: Diagnosis not present

## 2021-10-11 DIAGNOSIS — M79605 Pain in left leg: Secondary | ICD-10-CM | POA: Diagnosis present

## 2021-10-11 DIAGNOSIS — R0789 Other chest pain: Secondary | ICD-10-CM | POA: Diagnosis not present

## 2021-10-11 DIAGNOSIS — W208XXA Other cause of strike by thrown, projected or falling object, initial encounter: Secondary | ICD-10-CM | POA: Insufficient documentation

## 2021-10-11 LAB — COMPREHENSIVE METABOLIC PANEL
ALT: 16 U/L (ref 0–44)
AST: 25 U/L (ref 15–41)
Albumin: 3.6 g/dL (ref 3.5–5.0)
Alkaline Phosphatase: 42 U/L (ref 38–126)
Anion gap: 8 (ref 5–15)
BUN: 11 mg/dL (ref 6–20)
CO2: 26 mmol/L (ref 22–32)
Calcium: 9.1 mg/dL (ref 8.9–10.3)
Chloride: 106 mmol/L (ref 98–111)
Creatinine, Ser: 0.97 mg/dL (ref 0.61–1.24)
GFR, Estimated: >60 mL/min (ref >60)
Glucose, Bld: 77 mg/dL (ref 70–99)
Potassium: 3.6 mmol/L (ref 3.5–5.1)
Sodium: 140 mmol/L (ref 135–145)
Total Bilirubin: 0.7 mg/dL (ref 0.3–1.2)
Total Protein: 5.9 g/dL — ABNORMAL LOW (ref 6.5–8.1)

## 2021-10-11 LAB — CBC WITH DIFFERENTIAL/PLATELET
Abs Immature Granulocytes: 0.01 10*3/uL (ref 0.00–0.07)
Basophils Absolute: 0 10*3/uL (ref 0.0–0.1)
Basophils Relative: 1 %
Eosinophils Absolute: 0.2 10*3/uL (ref 0.0–0.5)
Eosinophils Relative: 3 %
HCT: 37.8 % — ABNORMAL LOW (ref 39.0–52.0)
Hemoglobin: 13.4 g/dL (ref 13.0–17.0)
Immature Granulocytes: 0 %
Lymphocytes Relative: 16 %
Lymphs Abs: 1 10*3/uL (ref 0.7–4.0)
MCH: 32.1 pg (ref 26.0–34.0)
MCHC: 35.4 g/dL (ref 30.0–36.0)
MCV: 90.4 fL (ref 80.0–100.0)
Monocytes Absolute: 0.6 10*3/uL (ref 0.1–1.0)
Monocytes Relative: 8 %
Neutro Abs: 4.8 10*3/uL (ref 1.7–7.7)
Neutrophils Relative %: 72 %
Platelets: 221 10*3/uL (ref 150–400)
RBC: 4.18 MIL/uL — ABNORMAL LOW (ref 4.22–5.81)
RDW: 12.2 % (ref 11.5–15.5)
WBC: 6.7 10*3/uL (ref 4.0–10.5)
nRBC: 0 % (ref 0.0–0.2)

## 2021-10-11 MED ORDER — ACETAMINOPHEN 325 MG PO TABS
325.0000 mg | ORAL_TABLET | Freq: Once | ORAL | Status: AC
  Administered 2021-10-11: 325 mg via ORAL
  Filled 2021-10-11: qty 1

## 2021-10-11 MED ORDER — CYCLOBENZAPRINE HCL 10 MG PO TABS: 10.0000 mg | ORAL_TABLET | Freq: Every evening | ORAL | 0 refills | Status: AC

## 2021-10-11 MED ORDER — CYCLOBENZAPRINE HCL 10 MG PO TABS: 10.0000 mg | ORAL_TABLET | Freq: Two times a day (BID) | ORAL | 0 refills | Status: DC | PRN

## 2021-10-11 MED ORDER — CYCLOBENZAPRINE HCL 10 MG PO TABS
5.0000 mg | ORAL_TABLET | Freq: Once | ORAL | Status: AC
  Administered 2021-10-11: 5 mg via ORAL
  Filled 2021-10-11: qty 1

## 2021-10-11 MED ORDER — LIDOCAINE 5 % EX PTCH
1.0000 | MEDICATED_PATCH | CUTANEOUS | Status: DC
  Administered 2021-10-11: 1 via TRANSDERMAL
  Filled 2021-10-11: qty 1

## 2021-10-11 NOTE — ED Triage Notes (Signed)
Pt c/o L ribcage, L leg & groin pain after tree fell & pinned him against building Saturday. Pt was cutting down tree, tree fell early, pt unable to move in time. Rib pain worse w inspiration, movement, cough. Pt ambulatory to triage ? ?Ibuprofen for pain, hasn't helped. ?

## 2021-10-11 NOTE — Discharge Instructions (Signed)
You were evaluated in the Emergency Department and after careful evaluation, we did not find any emergent condition requiring admission or further testing in the hospital.  Your exam/testing today was overall reassuring.  Please return to the Emergency Department if you experience any worsening of your condition.  Thank you for allowing us to be a part of your care.  

## 2021-10-11 NOTE — ED Provider Triage Note (Signed)
Emergency Medicine Provider Triage Evaluation Note ? ?Timothy Mcguire , a 31 y.o. male  was evaluated in triage.  Pt complains of left rib pain, left inguinal pain/left leg pain. Pt was cutting a tree down 2 days ago when a branch fell onto him ? ?Review of Systems  ?Positive: Left inguinal pain, rib pain ?Negative: sob ? ?Physical Exam  ?BP 100/64 (BP Location: Left Arm)   Pulse 76   Temp 98 ?F (36.7 ?C) (Oral)   Resp 16   SpO2 98%  ?Gen:   Awake, no distress   ?Resp:  Normal effort  ?MSK:   Moves extremities without difficulty  ?Other:  Ttp to the left inguinal area and left quad. Ttp to the left ribs. Heart rrr, lungs ctab ? ?Medical Decision Making  ?Medically screening exam initiated at 4:29 PM.  Appropriate orders placed.  Timothy Mcguire was informed that the remainder of the evaluation will be completed by another provider, this initial triage assessment does not replace that evaluation, and the importance of remaining in the ED until their evaluation is complete. ? ? ?  ?Karrie Meres, PA-C ?10/11/21 1635 ? ?

## 2021-10-11 NOTE — ED Provider Notes (Incomplete)
[facility] EMERGENCY DEPARTMENT Provider Note   CSN: [id number] Arrival date & time: 10/11/21  1523     History {Add pertinent medical, surgical, social history, OB history to HPI:1} Chief Complaint  Patient presents with   Chest Pain   Groin Pain    Timothy Mcguire is a 31 y.o. male.   Chest Pain Groin Pain Associated symptoms include chest pain.      Home Medications Prior to Admission medications   Medication Sig Start Date End Date Taking? Authorizing Provider  benzonatate (TESSALON) 100 MG capsule Take 1 capsule (100 mg total) by mouth 3 (three) times daily as needed for cough. Patient not taking: Reported on 09/21/2016 09/07/15   Catha Gosselin, PA-C  Calcium 600-400 MG-UNIT CHEW Chew 1 tablet by mouth daily. 10/03/16   Lizbeth Bark, FNP  cephALEXin (KEFLEX) 250 MG capsule Take 1 capsule (250 mg total) by mouth 4 (four) times daily. Patient not taking: Reported on 09/21/2016 07/29/16   Roxy Horseman, PA-C  docusate sodium (COLACE) 250 MG capsule Take 1 capsule (250 mg total) by mouth daily. 03/01/17   Kirichenko, Lemont Fillers, PA-C  erythromycin ophthalmic ointment Place a 1/2 inch ribbon of ointment into affect eye 4-6 times daily Patient not taking: Reported on 09/21/2016 10/01/15   Ward, Chase Picket, PA-C  famotidine (PEPCID) 20 MG tablet Take 1 tablet (20 mg total) by mouth 2 (two) times daily. Patient not taking: Reported on 09/21/2016 08/27/15   Linwood Dibbles, MD  HYDROcodone-acetaminophen (NORCO/VICODIN) 5-325 MG tablet Take 1 tablet by mouth every 6 (six) hours as needed for severe pain. Patient not taking: Reported on 09/21/2016 08/03/16   Earley Favor, NP  ibuprofen (ADVIL,MOTRIN) 200 MG tablet Take 200 mg by mouth every 6 (six) hours as needed for moderate pain.    [provider]  lidocaine (XYLOCAINE) 2 % solution Use as directed 20 mLs in the mouth or throat as needed for mouth pain. Patient not taking: Reported on 09/21/2016 09/07/15    Catha Gosselin, PA-C  MELATONIN PO Take 12 mg by mouth daily as needed. For sleep    [provider]  naproxen (NAPROSYN) 500 MG tablet Take 1 tablet (500 mg total) by mouth 2 (two) times daily. Patient not taking: Reported on 08/26/2015 05/27/15   Janne Napoleon, NP  oxyCODONE-acetaminophen (PERCOCET/ROXICET) 5-325 MG tablet Take 1 tablet by mouth every 6 (six) hours as needed for severe pain. Patient not taking: Reported on 09/21/2016 11/01/15   Mackuen, Cindee Salt, MD  traZODone (DESYREL) 50 MG tablet Take 0.5-1 tablets (25-50 mg total) by mouth at bedtime as needed for sleep. 09/21/16   Lizbeth Bark, FNP      Allergies    Tramadol    Review of Systems   Review of Systems  Cardiovascular:  Positive for chest pain.   Physical Exam Updated Vital Signs BP 100/64 (BP Location: Left Arm)    Pulse 76    Temp 98 F (36.7 C) (Oral)    Resp 16    SpO2 98%  Physical Exam  ED Results / Procedures / Treatments   Labs (all labs ordered are listed, but only abnormal results are displayed) Labs Reviewed  CBC WITH DIFFERENTIAL/PLATELET  COMPREHENSIVE METABOLIC PANEL    EKG None  Radiology DG Ribs Unilateral W/Chest Left  Result Date: 10/11/2021 CLINICAL DATA:  Left rib pain.  Hit with a tree branch EXAM: LEFT RIBS AND CHEST - 3+ VIEW COMPARISON:  None. FINDINGS: No fracture or  other bone lesions are seen involving the ribs. There is no evidence of pneumothorax or pleural effusion. Both lungs are clear. Heart size and mediastinal contours are within normal limits. IMPRESSION: Negative. Electronically Signed   By: Charlett Nose M.D.   On: 10/11/2021 17:03   DG Hip Unilat W or Wo Pelvis 2-3 Views Left  Result Date: 10/11/2021 CLINICAL DATA:  Tree fell on patient.  Left leg pain. EXAM: DG HIP (WITH OR WITHOUT PELVIS) 2-3V LEFT COMPARISON:  None. FINDINGS: There is no evidence of hip fracture or dislocation. There is no evidence of arthropathy or other focal bone abnormality.  IMPRESSION: No acute abnormality of the left hip. Electronically Signed   By: Acquanetta Belling M.D.   On: 10/11/2021 17:07    Procedures Procedures  {Document cardiac monitor, telemetry assessment procedure when appropriate:1}  Medications Ordered in ED Medications - No data to display  ED Course/ Medical Decision Making/ A&P                           Medical Decision Making  ***  {Document critical care time when appropriate:1} {Document review of labs and clinical decision tools ie heart score, Chads2Vasc2 etc:1}  {Document your independent review of radiology images, and any outside records:1} {Document your discussion with family members, caretakers, and with consultants:1} {Document social determinants of health affecting pt's care:1} {Document your decision making why or why not admission, treatments were needed:1} Final Clinical Impression(s) / ED Diagnoses Final diagnoses:  None    Rx / DC Orders ED Discharge Orders     None

## 2022-04-02 IMAGING — CR DG HIP (WITH OR WITHOUT PELVIS) 2-3V*L*
3 series · 3 of 3 positions shown · non-contrast
Comparison: None.

CLINICAL DATA: Tree fell on patient.  Left leg pain.

EXAM:
DG HIP (WITH OR WITHOUT PELVIS) 2-3V LEFT

[pelvis ap]
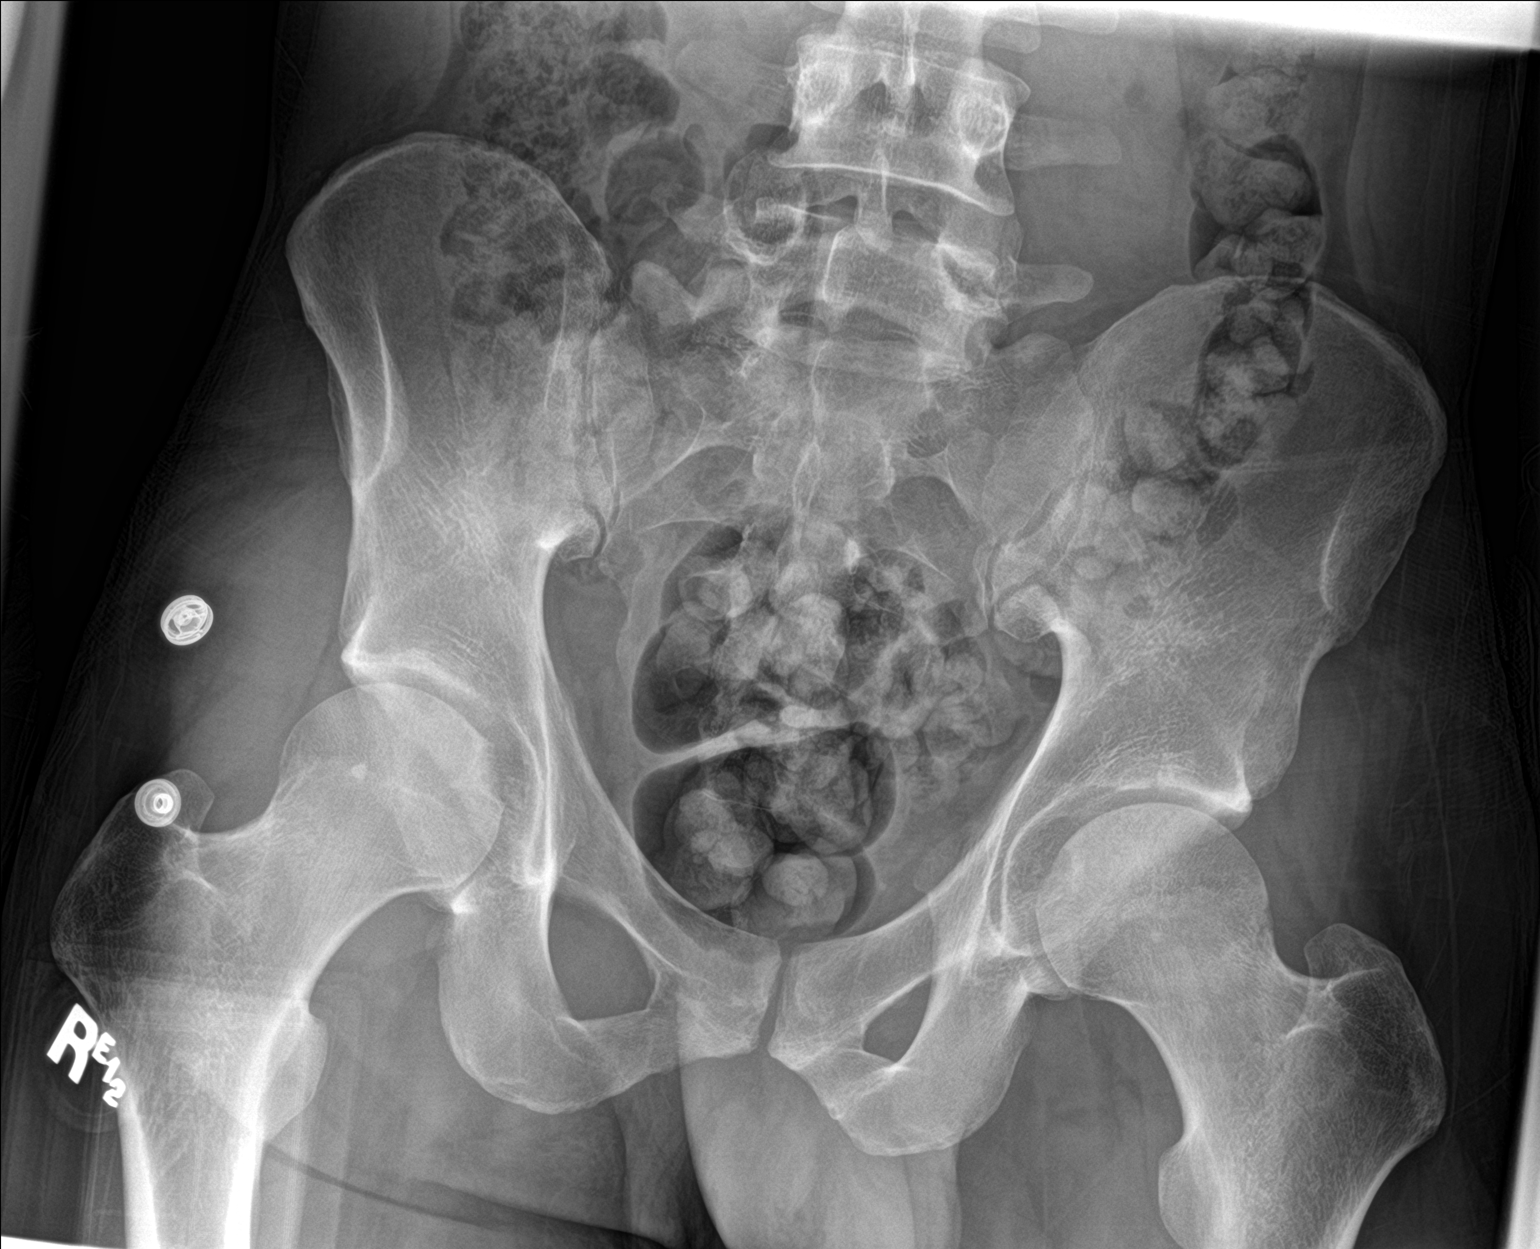

[hip ap]
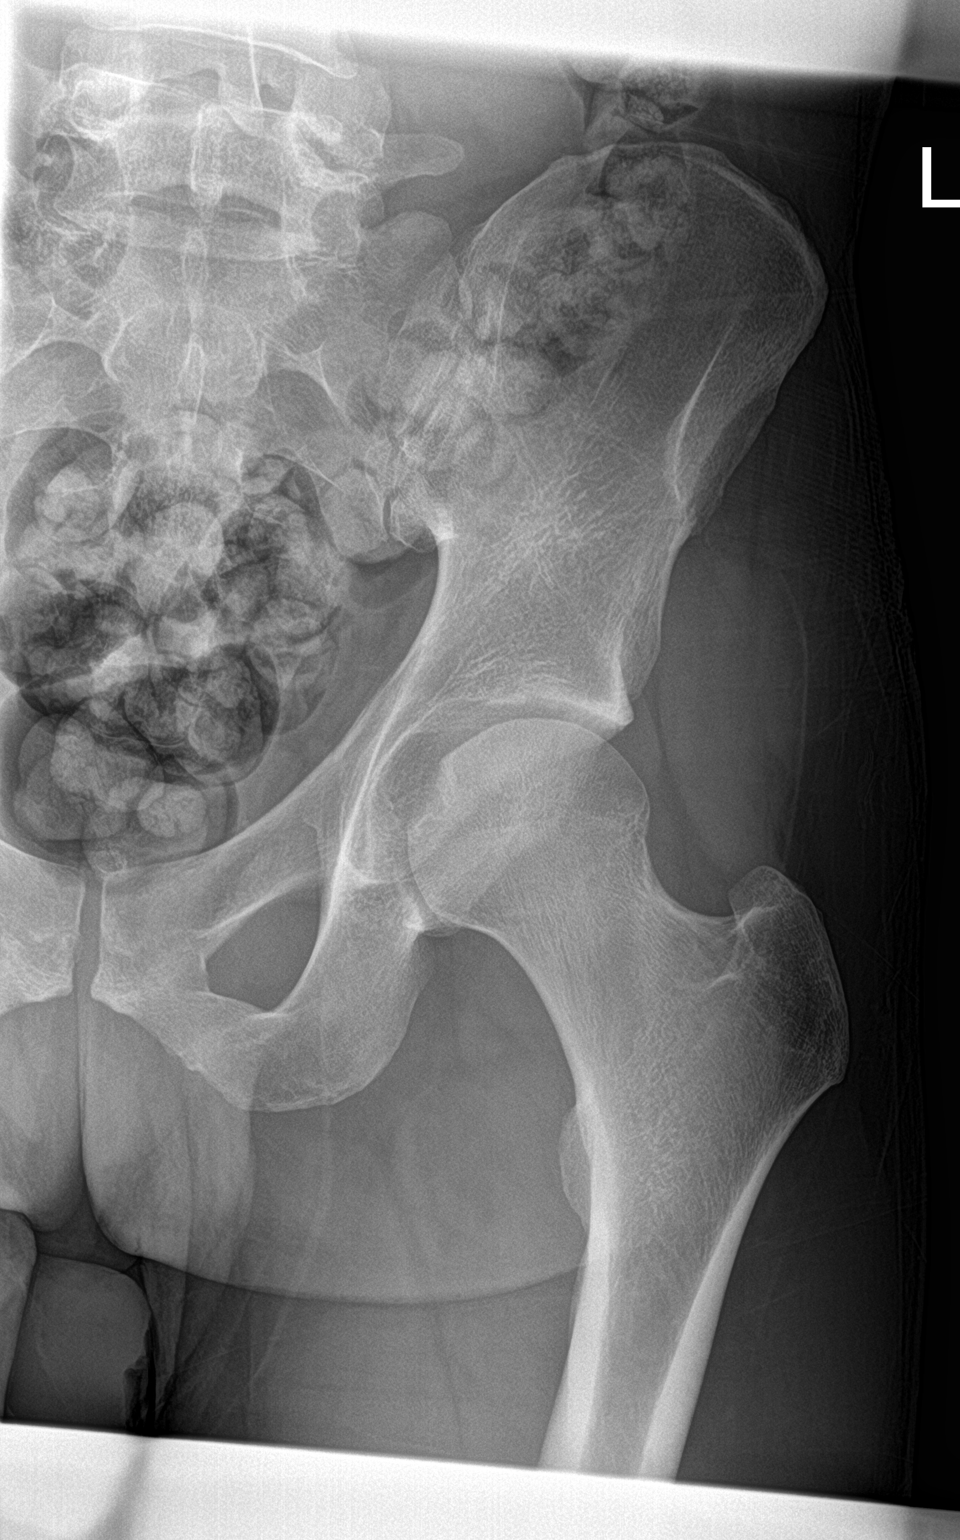

[hip lat]
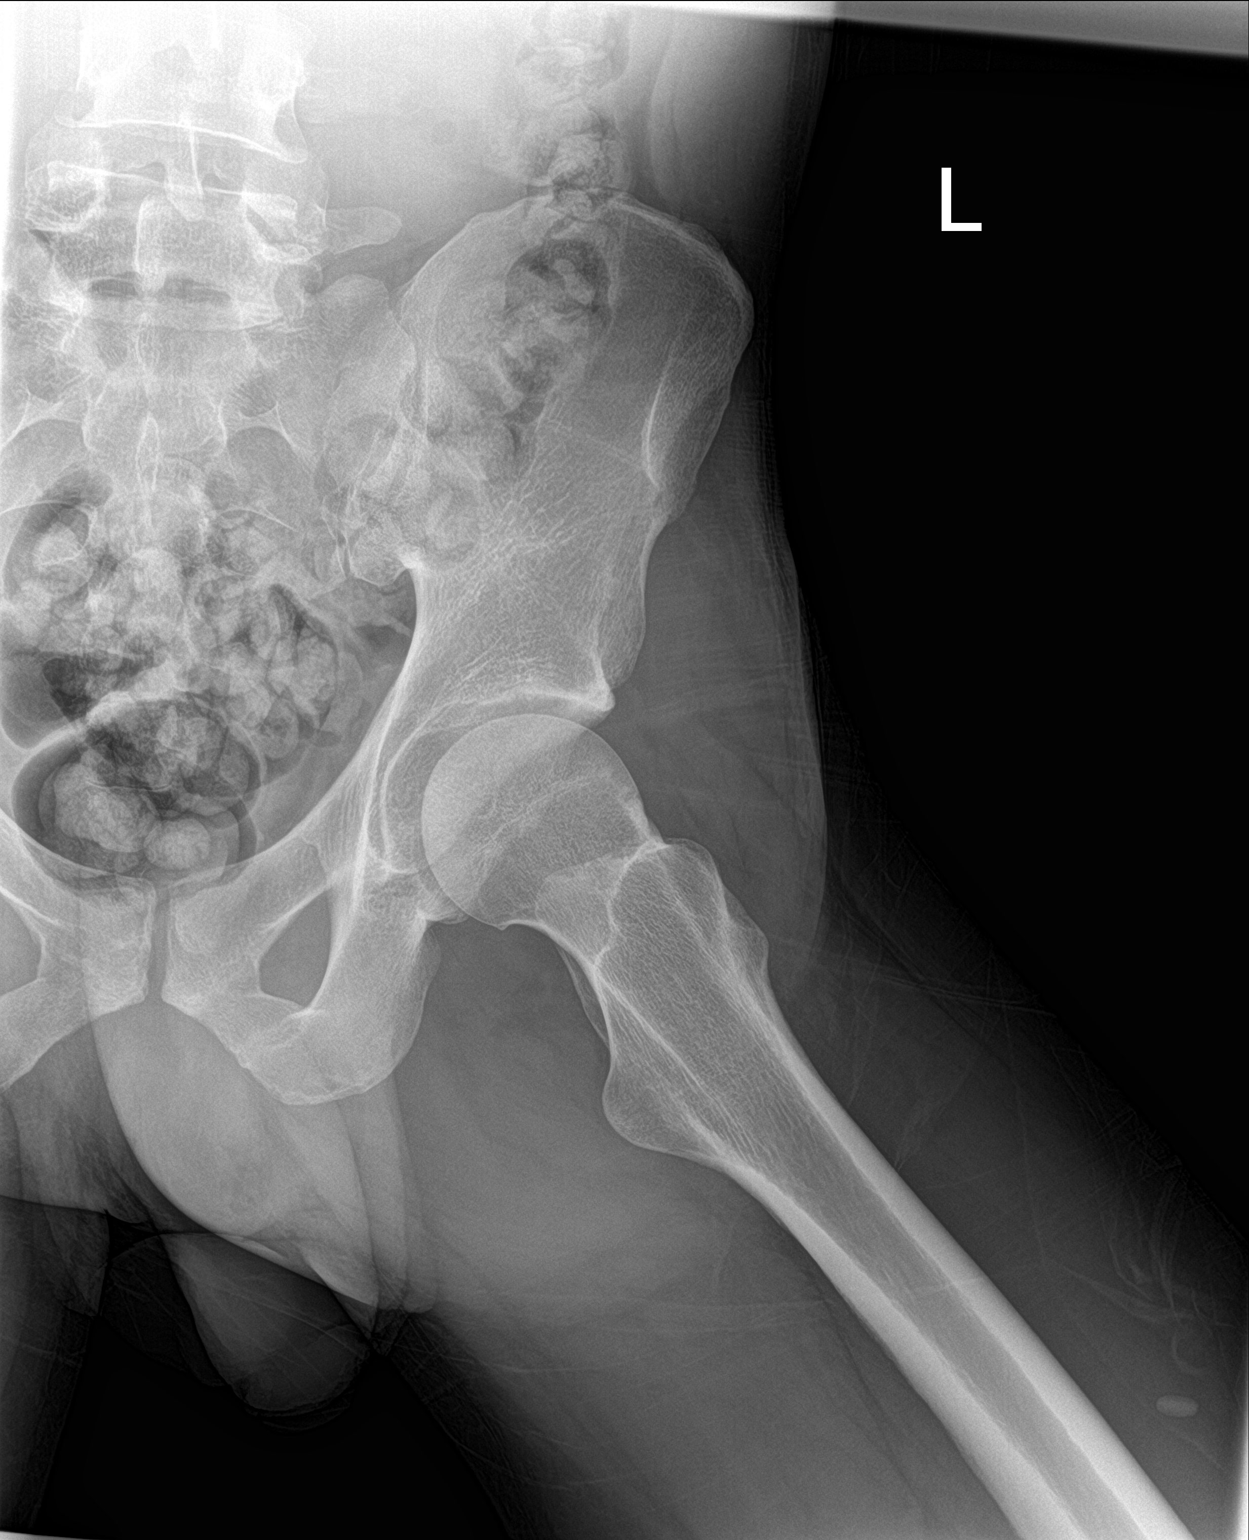

[3 of 3 positions shown; findings below may reference images not displayed]

FINDINGS: There is no evidence of hip fracture or dislocation. There is no
evidence of arthropathy or other focal bone abnormality.
IMPRESSION: No acute abnormality of the left hip.

## 2022-10-24 ENCOUNTER — Other Ambulatory Visit: Payer: Self-pay

## 2022-10-24 ENCOUNTER — Emergency Department (HOSPITAL_COMMUNITY): Payer: Self-pay

## 2022-10-24 ENCOUNTER — Encounter (HOSPITAL_COMMUNITY): Payer: Self-pay | Admitting: Emergency Medicine

## 2022-10-24 ENCOUNTER — Emergency Department (HOSPITAL_COMMUNITY)
Admission: EM | Admit: 2022-10-24 | Discharge: 2022-10-25 | Disposition: A | Discharge status: Home / Self Care | Payer: Self-pay | Attending: Emergency Medicine | Admitting: Emergency Medicine

## 2022-10-24 DIAGNOSIS — D72829 Elevated white blood cell count, unspecified: Secondary | ICD-10-CM | POA: Insufficient documentation

## 2022-10-24 DIAGNOSIS — D45 Polycythemia vera: Secondary | ICD-10-CM | POA: Insufficient documentation

## 2022-10-24 DIAGNOSIS — R55 Syncope and collapse: Secondary | ICD-10-CM | POA: Insufficient documentation

## 2022-10-24 DIAGNOSIS — E876 Hypokalemia: Secondary | ICD-10-CM | POA: Insufficient documentation

## 2022-10-24 DIAGNOSIS — E860 Dehydration: Secondary | ICD-10-CM | POA: Insufficient documentation

## 2022-10-24 LAB — CBC WITH DIFFERENTIAL/PLATELET
Abs Immature Granulocytes: 0.08 10*3/uL — ABNORMAL HIGH (ref 0.00–0.07)
Basophils Absolute: 0.1 10*3/uL (ref 0.0–0.1)
Basophils Relative: 1 %
Eosinophils Absolute: 0.1 10*3/uL (ref 0.0–0.5)
Eosinophils Relative: 1 %
HCT: 54.1 % — ABNORMAL HIGH (ref 39.0–52.0)
Hemoglobin: 20.3 g/dL — ABNORMAL HIGH (ref 13.0–17.0)
Immature Granulocytes: 1 %
Lymphocytes Relative: 10 %
Lymphs Abs: 1.4 10*3/uL (ref 0.7–4.0)
MCH: 31.9 pg (ref 26.0–34.0)
MCHC: 37.5 g/dL — ABNORMAL HIGH (ref 30.0–36.0)
MCV: 84.9 fL (ref 80.0–100.0)
Monocytes Absolute: 1.1 10*3/uL — ABNORMAL HIGH (ref 0.1–1.0)
Monocytes Relative: 8 %
Neutro Abs: 12 10*3/uL — ABNORMAL HIGH (ref 1.7–7.7)
Neutrophils Relative %: 79 %
Platelets: 348 10*3/uL (ref 150–400)
RBC: 6.37 MIL/uL — ABNORMAL HIGH (ref 4.22–5.81)
RDW: 12.2 % (ref 11.5–15.5)
WBC: 14.8 10*3/uL — ABNORMAL HIGH (ref 4.0–10.5)
nRBC: 0 % (ref 0.0–0.2)

## 2022-10-24 LAB — COMPREHENSIVE METABOLIC PANEL
ALT: 17 U/L (ref 0–44)
AST: 23 U/L (ref 15–41)
Albumin: 4.7 g/dL (ref 3.5–5.0)
Alkaline Phosphatase: 63 U/L (ref 38–126)
Anion gap: 15 (ref 5–15)
BUN: 14 mg/dL (ref 6–20)
CO2: 24 mmol/L (ref 22–32)
Calcium: 9.6 mg/dL (ref 8.9–10.3)
Chloride: 97 mmol/L — ABNORMAL LOW (ref 98–111)
Creatinine, Ser: 1.12 mg/dL (ref 0.61–1.24)
GFR, Estimated: >60 mL/min (ref >60)
Glucose, Bld: 124 mg/dL — ABNORMAL HIGH (ref 70–99)
Potassium: 3 mmol/L — ABNORMAL LOW (ref 3.5–5.1)
Sodium: 136 mmol/L (ref 135–145)
Total Bilirubin: 1.2 mg/dL (ref 0.3–1.2)
Total Protein: 7.5 g/dL (ref 6.5–8.1)

## 2022-10-24 LAB — MAGNESIUM: Magnesium: 2.5 mg/dL — ABNORMAL HIGH (ref 1.7–2.4)

## 2022-10-24 MED ORDER — ACETAMINOPHEN 500 MG PO TABS
1000.0000 mg | ORAL_TABLET | Freq: Once | ORAL | Status: AC
  Administered 2022-10-24: 1000 mg via ORAL
  Filled 2022-10-24: qty 2

## 2022-10-24 MED ORDER — ONDANSETRON HCL 4 MG/2ML IJ SOLN
4.0000 mg | Freq: Once | INTRAMUSCULAR | Status: AC
  Administered 2022-10-24: 4 mg via INTRAVENOUS
  Filled 2022-10-24: qty 2

## 2022-10-24 MED ORDER — POTASSIUM CHLORIDE CRYS ER 20 MEQ PO TBCR
40.0000 meq | EXTENDED_RELEASE_TABLET | Freq: Once | ORAL | Status: AC
  Administered 2022-10-24: 40 meq via ORAL
  Filled 2022-10-24: qty 2

## 2022-10-24 MED ORDER — SODIUM CHLORIDE 0.9 % IV BOLUS: 1000.0000 mL | Freq: Once | INTRAVENOUS | Status: DC

## 2022-10-24 MED ORDER — KETOROLAC TROMETHAMINE 30 MG/ML IJ SOLN
30.0000 mg | Freq: Once | INTRAMUSCULAR | Status: AC
  Administered 2022-10-24: 30 mg via INTRAVENOUS
  Filled 2022-10-24: qty 1

## 2022-10-24 MED ORDER — SODIUM CHLORIDE 0.9 % IV BOLUS
2000.0000 mL | Freq: Once | INTRAVENOUS | Status: AC
  Administered 2022-10-24: 2000 mL via INTRAVENOUS

## 2022-10-24 MED ORDER — ONDANSETRON HCL 4 MG PO TABS: 4.0000 mg | ORAL_TABLET | Freq: Four times a day (QID) | ORAL | 0 refills | Status: AC

## 2022-10-24 NOTE — ED Provider Triage Note (Signed)
Emergency Medicine Provider Triage Evaluation Note  Timothy Mcguire , a 32 y.o. male  was evaluated in triage.  Pt complains of loss of consciousness.  Patient reports that he had a loss of consciousness earlier today while he was showering. He reports that he was bending down in the shower when he passed out and then passed out again when he tried standing up. States he his his head in this fall. Pain in cervical spine and bilateral shoulders since the fall. Patient was recently sick with a GI illness and has had multiple episodes of nausea and vomiting prior to having syncopal episode.  Review of Systems  Positive: As above Negative: As above  Physical Exam  BP 108/72   Pulse (!) 134   Temp 98.4 F (36.9 C) (Oral)   Resp 17   Ht 6\' 1"  (1.854 m)   Wt 77.1 kg   SpO2 100%   BMI 22.43 kg/m  Gen:   Awake, no distress Resp:  Normal effort  MSK:   Moves extremities without difficulty, limited range of motion in bilateral shoulders due to pain Other:  Hematoma noted to the left parietal  Medical Decision Making  Medically screening exam initiated at 4:43 PM.  Appropriate orders placed.  Signe Colt was informed that the remainder of the evaluation will be completed by another provider, this initial triage assessment does not replace that evaluation, and the importance of remaining in the ED until their evaluation is complete.     Luvenia Heller, PA-C 10/24/22 1645

## 2022-10-24 NOTE — Discharge Instructions (Addendum)
Evaluation for your passing out today revealed that you were dehydrated.  This is likely why you passed out today.  Recommend that you do follow-up with your PCP.  And continue good hydration and advance her diet as tolerated at home.  If you pass out again, have chest pain, shortness of breath, facial droop, slurred speech, weakness or numbness in your extremities or any other concern please return emergency department for evaluation.

## 2022-10-24 NOTE — ED Provider Notes (Cosign Needed Addendum)
[facility] Provider Note   CSN: [id number] Arrival date & time: 10/24/22  1618     History  Chief Complaint  Patient presents with   Loss of Consciousness   HPI YONIC GUYMON is a 32 y.o. male presenting for loss of consciousness.  States he was taking a shower this morning when he felt lightheaded and then immediately "passed out". He hit the back of his head on the right side.  He was able to stand back up and continue to shower and then he passed out again and hit the left side of his head.  States both times he immediately returned to consciousness.  Denies chest pain, shortness of breath and associated headache at that time before syncopal events.  Also states that he has had persistent vomiting and diarrhea for the last 3 days.  He did feel better today and was planning to go to work.    Loss of Consciousness      Home Medications Prior to Admission medications   Medication Sig Start Date End Date Taking? Authorizing Provider  ondansetron (ZOFRAN) 4 MG tablet Take 1 tablet (4 mg total) by mouth every 6 (six) hours. 10/24/22  Yes Harriet Pho, PA-C  benzonatate (TESSALON) 100 MG capsule Take 1 capsule (100 mg total) by mouth 3 (three) times daily as needed for cough. Patient not taking: Reported on 09/21/2016 09/07/15   Ottie Glazier, PA-C  Calcium 600-400 MG-UNIT CHEW Chew 1 tablet by mouth daily. 10/03/16   Alfonse Spruce, FNP  cephALEXin (KEFLEX) 250 MG capsule Take 1 capsule (250 mg total) by mouth 4 (four) times daily. Patient not taking: Reported on 09/21/2016 07/29/16   Montine Circle, PA-C  cyclobenzaprine (FLEXERIL) 10 MG tablet Take 1 tablet (10 mg total) by mouth at bedtime. 10/11/21   Lupita Dawn, MD  docusate sodium (COLACE) 250 MG capsule Take 1 capsule (250 mg total) by mouth daily. 03/01/17   Kirichenko, Lahoma Rocker, PA-C  erythromycin ophthalmic ointment Place a 1/2 inch ribbon of ointment into affect eye 4-6  times daily Patient not taking: Reported on 09/21/2016 10/01/15   Ward, Ozella Almond, PA-C  famotidine (PEPCID) 20 MG tablet Take 1 tablet (20 mg total) by mouth 2 (two) times daily. Patient not taking: Reported on 09/21/2016 08/27/15   Dorie Rank, MD  HYDROcodone-acetaminophen (NORCO/VICODIN) 5-325 MG tablet Take 1 tablet by mouth every 6 (six) hours as needed for severe pain. Patient not taking: Reported on 09/21/2016 08/03/16   Junius Creamer, NP  ibuprofen (ADVIL,MOTRIN) 200 MG tablet Take 200 mg by mouth every 6 (six) hours as needed for moderate pain.    [provider]  lidocaine (XYLOCAINE) 2 % solution Use as directed 20 mLs in the mouth or throat as needed for mouth pain. Patient not taking: Reported on 09/21/2016 09/07/15   Ottie Glazier, PA-C  MELATONIN PO Take 12 mg by mouth daily as needed. For sleep    [provider]  naproxen (NAPROSYN) 500 MG tablet Take 1 tablet (500 mg total) by mouth 2 (two) times daily. Patient not taking: Reported on 08/26/2015 05/27/15   Ashley Murrain, NP  oxyCODONE-acetaminophen (PERCOCET/ROXICET) 5-325 MG tablet Take 1 tablet by mouth every 6 (six) hours as needed for severe pain. Patient not taking: Reported on 09/21/2016 11/01/15   Mackuen, Fredia Sorrow, MD  traZODone (DESYREL) 50 MG tablet Take 0.5-1 tablets (25-50 mg total) by mouth at bedtime as needed for sleep. 09/21/16  Alfonse Spruce, FNP      Allergies    Tramadol    Review of Systems   Review of Systems  Cardiovascular:  Positive for syncope.    Physical Exam   Vitals:   10/24/22 2026 10/24/22 2034  BP:  (!) 130/90  Pulse:  91  Resp:  18  Temp: 98.4 F (36.9 C) 98.1 F (36.7 C)  SpO2:  100%    CONSTITUTIONAL:  well-appearing, NAD NEURO:  GCS 15. Speech is goal oriented. No deficits appreciated to CN III-XII; symmetric eyebrow raise, no facial drooping, tongue midline. Patient has equal grip strength bilaterally with 5/5 strength against resistance in all major  muscle groups bilaterally. Sensation to light touch intact. Patient moves extremities without ataxia. Normal finger-nose-finger. Patient ambulatory with steady gait. Head: Small hematoma in the posterior right scalp.  No raccoon eyes, Battle sign or hemotympanum. EYES:  eyes equal and reactive ENT/NECK:  Supple, no stridor  CARDIO:  regular rate and rhythm, appears well-perfused PULM:  No respiratory distress, CTAB GI/GU:  non-distended, soft, non tender MSK/SPINE:  No gross deformities, no edema, moves all extremities  SKIN:  no rash, atraumatic   *Additional and/or pertinent findings included in MDM below    ED Results / Procedures / Treatments   Labs (all labs ordered are listed, but only abnormal results are displayed) Labs Reviewed  COMPREHENSIVE METABOLIC PANEL - Abnormal; Notable for the following components:      Result Value   Potassium 3.0 (*)    Chloride 97 (*)    Glucose, Bld 124 (*)    All other components within normal limits  CBC WITH DIFFERENTIAL/PLATELET - Abnormal; Notable for the following components:   WBC 14.8 (*)    RBC 6.37 (*)    Hemoglobin 20.3 (*)    HCT 54.1 (*)    MCHC 37.5 (*)    Neutro Abs 12.0 (*)    Monocytes Absolute 1.1 (*)    Abs Immature Granulocytes 0.08 (*)    All other components within normal limits  MAGNESIUM - Abnormal; Notable for the following components:   Magnesium 2.5 (*)    All other components within normal limits    EKG EKG Interpretation  Date/Time:  Monday October 24 2022 21:55:41 EDT Ventricular Rate:  86 PR Interval:  124 QRS Duration: 98 QT Interval:  352 QTC Calculation: 421 R Axis:   90 Text Interpretation: Sinus rhythm with Premature supraventricular complexes Rightward axis ST & T wave abnormality, consider anterolateral ischemia Abnormal ECG No previous ECGs available Confirmed by Lacretia Leigh ([id number]) on 10/24/2022 11:37:37 PM  Radiology DG Chest 2 View  Result Date: 10/24/2022 CLINICAL DATA:  syncope  EXAM: CHEST - 2 VIEW COMPARISON:  Chest x-ray 10/12/2018. FINDINGS: The heart and mediastinal contours are within normal limits. No focal consolidation. No pulmonary edema. No pleural effusion. No pneumothorax. No acute osseous abnormality. IMPRESSION: No active cardiopulmonary disease. Electronically Signed   By: Iven Finn M.D.   On: 10/24/2022 21:33   CT Head Wo Contrast  Result Date: 10/24/2022 CLINICAL DATA:  Head trauma, moderate-severe; Neck trauma, midline tenderness (Age 46-64y) EXAM: CT HEAD WITHOUT CONTRAST CT CERVICAL SPINE WITHOUT CONTRAST TECHNIQUE: Multidetector CT imaging of the head and cervical spine was performed following the standard protocol without intravenous contrast. Multiplanar CT image reconstructions of the cervical spine were also generated. RADIATION DOSE REDUCTION: This exam was performed according to the departmental dose-optimization program which includes automated exposure control, adjustment of the mA and/or kV  according to patient size and/or use of iterative reconstruction technique. COMPARISON:  MRI head 09/15/2011 FINDINGS: CT HEAD FINDINGS Brain: No evidence of large-territorial acute infarction. No parenchymal hemorrhage. No mass lesion. No extra-axial collection. No mass effect or midline shift. No hydrocephalus. Basilar cisterns are patent. Vascular: No hyperdense vessel. Skull: No acute fracture or focal lesion. Sinuses/Orbits: Paranasal sinuses and mastoid air cells are clear. The orbits are unremarkable. Other: None. CT CERVICAL SPINE FINDINGS Alignment: Normal. Skull base and vertebrae: No acute fracture. No aggressive appearing focal osseous lesion or focal pathologic process. Soft tissues and spinal canal: No prevertebral fluid or swelling. No visible canal hematoma. Upper chest: Unremarkable. Other: None. IMPRESSION: 1. No acute intracranial abnormality. 2. No acute displaced fracture or traumatic listhesis of the cervical spine. Electronically Signed    By: Iven Finn M.D.   On: 10/24/2022 18:06   CT Cervical Spine Wo Contrast  Result Date: 10/24/2022 CLINICAL DATA:  Head trauma, moderate-severe; Neck trauma, midline tenderness (Age 22-64y) EXAM: CT HEAD WITHOUT CONTRAST CT CERVICAL SPINE WITHOUT CONTRAST TECHNIQUE: Multidetector CT imaging of the head and cervical spine was performed following the standard protocol without intravenous contrast. Multiplanar CT image reconstructions of the cervical spine were also generated. RADIATION DOSE REDUCTION: This exam was performed according to the departmental dose-optimization program which includes automated exposure control, adjustment of the mA and/or kV according to patient size and/or use of iterative reconstruction technique. COMPARISON:  MRI head 09/15/2011 FINDINGS: CT HEAD FINDINGS Brain: No evidence of large-territorial acute infarction. No parenchymal hemorrhage. No mass lesion. No extra-axial collection. No mass effect or midline shift. No hydrocephalus. Basilar cisterns are patent. Vascular: No hyperdense vessel. Skull: No acute fracture or focal lesion. Sinuses/Orbits: Paranasal sinuses and mastoid air cells are clear. The orbits are unremarkable. Other: None. CT CERVICAL SPINE FINDINGS Alignment: Normal. Skull base and vertebrae: No acute fracture. No aggressive appearing focal osseous lesion or focal pathologic process. Soft tissues and spinal canal: No prevertebral fluid or swelling. No visible canal hematoma. Upper chest: Unremarkable. Other: None. IMPRESSION: 1. No acute intracranial abnormality. 2. No acute displaced fracture or traumatic listhesis of the cervical spine. Electronically Signed   By: Iven Finn M.D.   On: 10/24/2022 18:06   DG Shoulder Left  Result Date: 10/24/2022 CLINICAL DATA:  Pain EXAM: LEFT SHOULDER - 3 VIEW; RIGHT SHOULDER - 3 VIEW COMPARISON:  None Available. FINDINGS: Right shoulder: There is no evidence of fracture or dislocation. There is no evidence of  arthropathy or other focal bone abnormality. Soft tissues are unremarkable. Left shoulder: There is no evidence of fracture or dislocation. There is no evidence of arthropathy or other focal bone abnormality. Soft tissues are unremarkable. IMPRESSION: Negative. Electronically Signed   By: Yetta Glassman M.D.   On: 10/24/2022 17:23   DG Shoulder Right  Result Date: 10/24/2022 CLINICAL DATA:  Pain EXAM: LEFT SHOULDER - 3 VIEW; RIGHT SHOULDER - 3 VIEW COMPARISON:  None Available. FINDINGS: Right shoulder: There is no evidence of fracture or dislocation. There is no evidence of arthropathy or other focal bone abnormality. Soft tissues are unremarkable. Left shoulder: There is no evidence of fracture or dislocation. There is no evidence of arthropathy or other focal bone abnormality. Soft tissues are unremarkable. IMPRESSION: Negative. Electronically Signed   By: Yetta Glassman M.D.   On: 10/24/2022 17:23    Procedures Procedures    Medications Ordered in ED Medications  acetaminophen (TYLENOL) tablet 1,000 mg (1,000 mg Oral Given 10/24/22 1649)  ondansetron (  ZOFRAN) injection 4 mg (4 mg Intravenous Given 10/24/22 2143)  ketorolac (TORADOL) 30 MG/ML injection 30 mg (30 mg Intravenous Given 10/24/22 2147)  potassium chloride SA (KLOR-CON M) CR tablet 40 mEq (40 mEq Oral Given 10/24/22 2146)  sodium chloride 0.9 % bolus 2,000 mL (0 mLs Intravenous Stopped 10/24/22 2303)    ED Course/ Medical Decision Making/ A&P                             Medical Decision Making Amount and/or Complexity of Data Reviewed Labs: ordered. Radiology: ordered.  Risk Prescription drug management.   Initial Impression and Ddx 31 year old male who is well-appearing presenting for syncopal episode x 2 today.  Exam remarkable for hematoma in the right posterior scalp but otherwise reassuring.  DDx includes arrhythmia, neurogenic syncope, dehydration, electrolyte derangement and ACS. Patient PMH that increases  complexity of ED encounter: recent h/o n/v/d  Interpretation of Diagnostics - I independent reviewed and interpreted the labs as followed: Polycythemia, leukocytosis, hypokalemia  - I independently visualized the following imaging with scope of interpretation limited to determining acute life threatening conditions related to emergency care: CT scans and xrays, which revealed no acute findings  - I personally reviewed and interpreted EKG which revealed sinus rhythm with PVC  Patient Reassessment and Ultimate Disposition/Management Overall suspect dehydration is etiology of syncope.  Treated with volume resuscitation.  Treat his pain with Toradol and Tylenol.  Treated nausea with Zofran.  Treated hypokalemia with oral potassium.  Fluid challenge with no issue.  Fortunately CT scan did not reveal any acute injuries related to his fall.  Patient stated he felt much better after treatment.  Discharged with stable vitals.  Advised to follow-up with his PCP.  Discussed return precautions.  Patient management required discussion with the following services or consulting groups:  None  Complexity of Problems Addressed Acute complicated illness or Injury  Additional Data Reviewed and Analyzed Further history obtained from: Prior ED visit notes  Patient Encounter Risk Assessment Prescriptions         Final Clinical Impression(s) / ED Diagnoses Final diagnoses:  Dehydration    Rx / DC Orders ED Discharge Orders          Ordered    ondansetron (ZOFRAN) 4 MG tablet  Every 6 hours        10/24/22 2347              Harriet Pho, PA-C 10/24/22 2350    Harriet Pho, PA-C 10/25/22 0000    Lacretia Leigh, MD 10/25/22 1751

## 2022-10-24 NOTE — ED Triage Notes (Signed)
Per [facility] pt coming from extended stay hotel. Patients cousin reports patient having 2 syncopal episodes. Patient c/o dizziness, upper back pain and hematoma to back of head. Overall not feeling well over past 4 days.
